# Patient Record
Sex: Female | Born: 1935 | Race: White | Hispanic: No | Smoking: Never smoker
Health system: Southern US, Community
[De-identification: ages and names within clinical notes are randomized; demographics above are authoritative.]

## PROBLEM LIST (undated history)

## (undated) DIAGNOSIS — J690 Pneumonitis due to inhalation of food and vomit: Secondary | ICD-10-CM

## (undated) DIAGNOSIS — R1310 Dysphagia, unspecified: Secondary | ICD-10-CM

## (undated) DIAGNOSIS — Z3490 Encounter for supervision of normal pregnancy, unspecified, unspecified trimester: Secondary | ICD-10-CM

## (undated) DIAGNOSIS — I4891 Unspecified atrial fibrillation: Secondary | ICD-10-CM

## (undated) DIAGNOSIS — H409 Unspecified glaucoma: Secondary | ICD-10-CM

## (undated) HISTORY — DX: Dysphagia, unspecified: R13.10

## (undated) HISTORY — DX: Pneumonitis due to inhalation of food and vomit: J69.0

## (undated) HISTORY — DX: Unspecified atrial fibrillation: I48.91

## (undated) HISTORY — PX: TONSILLECTOMY: SUR1361

---
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
## 2017-10-14 DIAGNOSIS — H409 Unspecified glaucoma: Secondary | ICD-10-CM

## 2017-10-14 HISTORY — DX: Unspecified glaucoma: H40.9

## 2017-12-16 ENCOUNTER — Other Ambulatory Visit: Payer: Self-pay

## 2017-12-16 ENCOUNTER — Inpatient Hospital Stay (HOSPITAL_COMMUNITY)
Admission: EM | Admit: 2017-12-16 | Discharge: 2017-12-20 | DRG: 177 | Disposition: A | Discharge status: Hospice | Payer: Medicare Other | Attending: Internal Medicine | Admitting: Internal Medicine

## 2017-12-16 ENCOUNTER — Emergency Department (HOSPITAL_COMMUNITY): Payer: Medicare Other

## 2017-12-16 ENCOUNTER — Encounter (HOSPITAL_COMMUNITY): Payer: Self-pay | Admitting: Radiology

## 2017-12-16 ENCOUNTER — Inpatient Hospital Stay (HOSPITAL_COMMUNITY): Payer: Medicare Other

## 2017-12-16 DIAGNOSIS — E861 Hypovolemia: Secondary | ICD-10-CM | POA: Diagnosis present

## 2017-12-16 DIAGNOSIS — Z7189 Other specified counseling: Secondary | ICD-10-CM | POA: Diagnosis not present

## 2017-12-16 DIAGNOSIS — J9601 Acute respiratory failure with hypoxia: Secondary | ICD-10-CM

## 2017-12-16 DIAGNOSIS — Z515 Encounter for palliative care: Secondary | ICD-10-CM | POA: Diagnosis not present

## 2017-12-16 DIAGNOSIS — J470 Bronchiectasis with acute lower respiratory infection: Secondary | ICD-10-CM | POA: Diagnosis not present

## 2017-12-16 DIAGNOSIS — I4892 Unspecified atrial flutter: Secondary | ICD-10-CM | POA: Diagnosis present

## 2017-12-16 DIAGNOSIS — Z66 Do not resuscitate: Secondary | ICD-10-CM | POA: Diagnosis not present

## 2017-12-16 DIAGNOSIS — I2584 Coronary atherosclerosis due to calcified coronary lesion: Secondary | ICD-10-CM | POA: Diagnosis not present

## 2017-12-16 DIAGNOSIS — I517 Cardiomegaly: Secondary | ICD-10-CM

## 2017-12-16 DIAGNOSIS — I2510 Atherosclerotic heart disease of native coronary artery without angina pectoris: Secondary | ICD-10-CM | POA: Diagnosis present

## 2017-12-16 DIAGNOSIS — R1310 Dysphagia, unspecified: Secondary | ICD-10-CM | POA: Diagnosis not present

## 2017-12-16 DIAGNOSIS — J479 Bronchiectasis, uncomplicated: Secondary | ICD-10-CM

## 2017-12-16 DIAGNOSIS — I4891 Unspecified atrial fibrillation: Secondary | ICD-10-CM | POA: Diagnosis present

## 2017-12-16 DIAGNOSIS — N179 Acute kidney failure, unspecified: Secondary | ICD-10-CM | POA: Diagnosis not present

## 2017-12-16 DIAGNOSIS — R627 Adult failure to thrive: Secondary | ICD-10-CM | POA: Diagnosis present

## 2017-12-16 DIAGNOSIS — I350 Nonrheumatic aortic (valve) stenosis: Secondary | ICD-10-CM | POA: Diagnosis present

## 2017-12-16 DIAGNOSIS — R4702 Dysphasia: Secondary | ICD-10-CM | POA: Diagnosis present

## 2017-12-16 DIAGNOSIS — Z79899 Other long term (current) drug therapy: Secondary | ICD-10-CM | POA: Diagnosis not present

## 2017-12-16 DIAGNOSIS — N189 Chronic kidney disease, unspecified: Secondary | ICD-10-CM | POA: Diagnosis not present

## 2017-12-16 DIAGNOSIS — N183 Chronic kidney disease, stage 3 (moderate): Secondary | ICD-10-CM | POA: Diagnosis present

## 2017-12-16 DIAGNOSIS — R011 Cardiac murmur, unspecified: Secondary | ICD-10-CM | POA: Diagnosis present

## 2017-12-16 DIAGNOSIS — L8990 Pressure ulcer of unspecified site, unspecified stage: Secondary | ICD-10-CM | POA: Diagnosis present

## 2017-12-16 DIAGNOSIS — J690 Pneumonitis due to inhalation of food and vomit: Secondary | ICD-10-CM

## 2017-12-16 DIAGNOSIS — H409 Unspecified glaucoma: Secondary | ICD-10-CM | POA: Diagnosis present

## 2017-12-16 DIAGNOSIS — Z823 Family history of stroke: Secondary | ICD-10-CM

## 2017-12-16 DIAGNOSIS — I959 Hypotension, unspecified: Secondary | ICD-10-CM | POA: Diagnosis present

## 2017-12-16 DIAGNOSIS — R7309 Other abnormal glucose: Secondary | ICD-10-CM

## 2017-12-16 HISTORY — DX: Unspecified glaucoma: H40.9

## 2017-12-16 HISTORY — DX: Pneumonitis due to inhalation of food and vomit: J69.0

## 2017-12-16 HISTORY — DX: Unspecified atrial fibrillation: I48.91

## 2017-12-16 HISTORY — DX: Encounter for supervision of normal pregnancy, unspecified, unspecified trimester: Z34.90

## 2017-12-16 LAB — URINALYSIS, ROUTINE W REFLEX MICROSCOPIC
BILIRUBIN URINE: NEGATIVE
Glucose, UA: NEGATIVE mg/dL
KETONES UR: NEGATIVE mg/dL
LEUKOCYTES UA: NEGATIVE
Nitrite: NEGATIVE
PROTEIN: 30 mg/dL — AB
Specific Gravity, Urine: 1.018 (ref 1.005–1.030)
pH: 5 (ref 5.0–8.0)

## 2017-12-16 LAB — COMPREHENSIVE METABOLIC PANEL
ALT: 32 U/L (ref 14–54)
ANION GAP: 14 (ref 5–15)
AST: 85 U/L — AB (ref 15–41)
Albumin: 3.9 g/dL (ref 3.5–5.0)
Alkaline Phosphatase: 93 U/L (ref 38–126)
BUN: 27 mg/dL — AB (ref 6–20)
CHLORIDE: 103 mmol/L (ref 101–111)
CO2: 21 mmol/L — ABNORMAL LOW (ref 22–32)
Calcium: 9.5 mg/dL (ref 8.9–10.3)
Creatinine, Ser: 1.5 mg/dL — ABNORMAL HIGH (ref 0.44–1.00)
GFR calc Af Amer: 33 mL/min — ABNORMAL LOW (ref >60)
GFR, EST NON AFRICAN AMERICAN: 29 mL/min — AB (ref >60)
Glucose, Bld: 136 mg/dL — ABNORMAL HIGH (ref 65–99)
POTASSIUM: 3.8 mmol/L (ref 3.5–5.1)
Sodium: 138 mmol/L (ref 135–145)
TOTAL PROTEIN: 7.7 g/dL (ref 6.5–8.1)
Total Bilirubin: 1.1 mg/dL (ref 0.3–1.2)

## 2017-12-16 LAB — CBC
HCT: 38.4 % (ref 36.0–46.0)
HEMATOCRIT: 37 % (ref 36.0–46.0)
Hemoglobin: 13.3 g/dL (ref 12.0–15.0)
Hemoglobin: 12.9 g/dL (ref 12.0–15.0)
MCH: 31 pg (ref 26.0–34.0)
MCH: 31.3 pg (ref 26.0–34.0)
MCHC: 34.6 g/dL (ref 30.0–36.0)
MCHC: 34.9 g/dL (ref 30.0–36.0)
MCV: 89.5 fL (ref 78.0–100.0)
MCV: 89.8 fL (ref 78.0–100.0)
PLATELETS: 220 10*3/uL (ref 150–400)
Platelets: 214 10*3/uL (ref 150–400)
RBC: 4.29 MIL/uL (ref 3.87–5.11)
RBC: 4.12 MIL/uL (ref 3.87–5.11)
RDW: 14 % (ref 11.5–15.5)
RDW: 14 % (ref 11.5–15.5)
WBC: 12 10*3/uL — AB (ref 4.0–10.5)
WBC: 5.3 10*3/uL (ref 4.0–10.5)

## 2017-12-16 LAB — CREATININE, SERUM
CREATININE: 1.37 mg/dL — AB (ref 0.44–1.00)
GFR, EST AFRICAN AMERICAN: 37 mL/min — AB (ref >60)
GFR, EST NON AFRICAN AMERICAN: 32 mL/min — AB (ref >60)

## 2017-12-16 LAB — I-STAT TROPONIN, ED: Troponin i, poc: 0.06 ng/mL (ref 0.00–0.08)

## 2017-12-16 MED ORDER — SODIUM CHLORIDE 0.9 % IV SOLN
INTRAVENOUS | Status: DC
  Administered 2017-12-16: 21:00:00 via INTRAVENOUS

## 2017-12-16 MED ORDER — SODIUM CHLORIDE 0.9 % IV SOLN
1.0000 g | Freq: Once | INTRAVENOUS | Status: AC
  Administered 2017-12-16: 1 g via INTRAVENOUS
  Filled 2017-12-16: qty 10

## 2017-12-16 MED ORDER — IPRATROPIUM-ALBUTEROL 0.5-2.5 (3) MG/3ML IN SOLN
3.0000 mL | Freq: Once | RESPIRATORY_TRACT | Status: AC
  Administered 2017-12-16: 3 mL via RESPIRATORY_TRACT
  Filled 2017-12-16: qty 3

## 2017-12-16 MED ORDER — DILTIAZEM HCL 30 MG PO TABS: 30.0000 mg | ORAL_TABLET | Freq: Four times a day (QID) | ORAL | Status: DC

## 2017-12-16 MED ORDER — SODIUM CHLORIDE 0.9 % IV SOLN: 1.0000 g | INTRAVENOUS | Status: DC

## 2017-12-16 MED ORDER — ENOXAPARIN SODIUM 30 MG/0.3ML ~~LOC~~ SOLN
30.0000 mg | SUBCUTANEOUS | Status: DC
  Administered 2017-12-16 – 2017-12-18 (×3): 30 mg via SUBCUTANEOUS
  Filled 2017-12-16 (×3): qty 0.3

## 2017-12-16 MED ORDER — BRIMONIDINE TARTRATE-TIMOLOL 0.2-0.5 % OP SOLN
1.0000 [drp] | Freq: Two times a day (BID) | OPHTHALMIC | Status: DC
  Filled 2017-12-16: qty 5

## 2017-12-16 MED ORDER — DOXYCYCLINE HYCLATE 100 MG PO TABS
100.0000 mg | ORAL_TABLET | Freq: Once | ORAL | Status: AC
  Administered 2017-12-16: 100 mg via ORAL
  Filled 2017-12-16: qty 1

## 2017-12-16 MED ORDER — DILTIAZEM HCL 25 MG/5ML IV SOLN
10.0000 mg | Freq: Once | INTRAVENOUS | Status: AC
Start: 2017-12-16 — End: 2017-12-16
  Administered 2017-12-16: 10 mg via INTRAVENOUS
  Filled 2017-12-16: qty 5

## 2017-12-16 MED ORDER — DOXYCYCLINE HYCLATE 100 MG IV SOLR
100.0000 mg | Freq: Two times a day (BID) | INTRAVENOUS | Status: DC
Start: 2017-12-17 — End: 2017-12-17
  Administered 2017-12-17: 100 mg via INTRAVENOUS
  Filled 2017-12-16: qty 100

## 2017-12-16 MED ORDER — LATANOPROST 0.005 % OP SOLN
1.0000 [drp] | Freq: Every day | OPHTHALMIC | Status: DC
  Administered 2017-12-16 – 2017-12-19 (×4): 1 [drp] via OPHTHALMIC
  Filled 2017-12-16: qty 2.5

## 2017-12-16 MED ORDER — TIMOLOL MALEATE 0.5 % OP SOLN
1.0000 [drp] | Freq: Two times a day (BID) | OPHTHALMIC | Status: DC
Start: 2017-12-16 — End: 2017-12-20
  Administered 2017-12-16 – 2017-12-20 (×8): 1 [drp] via OPHTHALMIC
  Filled 2017-12-16: qty 5

## 2017-12-16 MED ORDER — BRIMONIDINE TARTRATE 0.2 % OP SOLN
1.0000 [drp] | Freq: Two times a day (BID) | OPHTHALMIC | Status: DC
Start: 2017-12-16 — End: 2017-12-20
  Administered 2017-12-16 – 2017-12-20 (×8): 1 [drp] via OPHTHALMIC
  Filled 2017-12-16: qty 5

## 2017-12-16 MED ORDER — DILTIAZEM HCL 100 MG IV SOLR
5.0000 mg/h | INTRAVENOUS | Status: DC
  Administered 2017-12-16: 5 mg/h via INTRAVENOUS
  Filled 2017-12-16: qty 100

## 2017-12-16 MED ORDER — DOXYCYCLINE HYCLATE 100 MG PO TABS: 100.0000 mg | ORAL_TABLET | Freq: Two times a day (BID) | ORAL | Status: DC

## 2017-12-16 MED ORDER — ENOXAPARIN SODIUM 40 MG/0.4ML ~~LOC~~ SOLN: 40.0000 mg | SUBCUTANEOUS | Status: DC

## 2017-12-16 NOTE — ED Provider Notes (Signed)
[facility] Provider Note   CSN: [id number] Arrival date & time: 12/16/17  1141     History   Chief Complaint Chief Complaint  Patient presents with  . Weakness    HPI Natalie Holt is a 84 y.o. female.  HPI   Patient is a very pleasant 84 year old female brought in by her son.  Patient lives at home.  She has no complaints.  She does not want to be here and feels her usual state of health.  She is brought in by son.  Joanne GavelSutton came into the patient's house today and she was on the toilet he asked whether she needs help she said no.  But she was unable to get up off the toilet her seat herself.  He helped her stand from the toilet.  He feels that she is globally weak.  She denies. focal symptoms.  Patient is not seen a physician over 50 years.  Patient's son thinks that she needs to be in a higher level of care than living by herself on the second floor.  Patient herself denies any chest pain shortness of breath weakness or other complaints.   No past medical history on file.  There are no active problems to display for this patient.     OB History    No data available       Home Medications    Prior to Admission medications   Not on File    Family History No family history on file.  Social History Social History   Tobacco Use  . Smoking status: Not on file  Substance Use Topics  . Alcohol use: Not on file  . Drug use: Not on file     Allergies   Patient has no allergy information on record.   Review of Systems Review of Systems  Constitutional: Negative for activity change.  Respiratory: Negative for shortness of breath.   Cardiovascular: Negative for chest pain.  Gastrointestinal: Negative for abdominal pain.     Physical Exam Updated Vital Signs BP (!) 120/55 (BP Location: Right Arm)   Temp 97.8 F (36.6 C) (Oral)   Resp (!) 23   SpO2 97%   Physical Exam  Constitutional: She is oriented to person,  place, and time. She appears well-developed and well-nourished.  HENT:  Head: Normocephalic and atraumatic.  Eyes: Right eye exhibits no discharge. Left eye exhibits no discharge.  Cardiovascular: Normal rate and regular rhythm.  Murmur heard. Loud systolic murmur.  Pulmonary/Chest: Effort normal and breath sounds normal. She has no wheezes. She has no rales.  Abdominal: Soft. She exhibits no distension. There is no tenderness.  Neurological: She is oriented to person, place, and time. No cranial nerve deficit.  Skin: Skin is warm and dry. She is not diaphoretic.  Psychiatric: She has a normal mood and affect.  Nursing note and vitals reviewed.    ED Treatments / Results  Labs (all labs ordered are listed, but only abnormal results are displayed) Labs Reviewed  COMPREHENSIVE METABOLIC PANEL  CBC WITH DIFFERENTIAL/PLATELET  URINALYSIS, ROUTINE W REFLEX MICROSCOPIC  I-STAT TROPONIN, ED    EKG  EKG Interpretation  Date/Time:  Tuesday December 16 2017 12:49:08 EST Ventricular Rate:  93 PR Interval:    QRS Duration: 75 QT Interval:  383 QTC Calculation: 477 R Axis:   21 Text Interpretation:  Sinus rhythm Normal sinus rhythm Confirmed by Corlis LeakMackuen, Yuleidy Rappleye ([id number]) on 12/16/2017 12:57:05 PM  Radiology No results found.  Procedures Procedures (including critical care time)  CRITICAL CARE Performed by: Arlana Hove Total critical care time: 75 minutes Critical care time was exclusive of separately billable procedures and treating other patients. Critical care was necessary to treat or prevent imminent or life-threatening deterioration. Critical care was time spent personally by me on the following activities: development of treatment plan with patient and/or surrogate as well as nursing, discussions with consultants, evaluation of patient's response to treatment, examination of patient, obtaining history from patient or surrogate, ordering and performing treatments  and interventions, ordering and review of laboratory studies, ordering and review of radiographic studies, pulse oximetry and re-evaluation of patient's condition.   Medications Ordered in ED Medications - No data to display   Initial Impression / Assessment and Plan / ED Course  I have reviewed the triage vital signs and the nursing notes.  Pertinent labs & imaging results that were available during my care of the patient were reviewed by me and considered in my medical decision making (see chart for details).     Patient is a very pleasant 84 year old female brought in by her son.  Patient lives at home.  She has no complaints.  She does not want to be here and feels her usual state of health.  She is brought in by son.  Joanne Gavel came into the patient's house today and she was on the toilet he asked whether she needs help she said no.  But she was unable to get up off the toilet her seat herself.  He helped her stand from the toilet.  He feels that she is globally weak.  She denies. focal symptoms.  Patient is not seen a physician over 50 years.  Patient's son thinks that she needs to be in a higher level of care than living by herself on the second floor.  Patient herself denies any chest pain shortness of breath weakness or other complaints.  12:57 PM Patient appears very well.  Vital signs are normal.  Physical exam is reassuring.  We will get baseline lab work, however I am not sure patient will meet inpatient requirements for admission.  We will have case management touch base with son.  Patient clearly does have a systolic murmur which I suspect to be moderate to severe aortic stenosis, however the age of 59 and not likely a great surgical candidate, will have patient follow-up with an outpatient cardiology.  3 pm.  Awaiting patient's labs.  Xray normal. Will striaght cath for urine.    4:52 PM Patient is now looks much worse.  Patient had a cough earlier in the day but has now been  coughing constantly, gurgling sounds.  Patient not hypoxic, requiring oxygen people patent patient's heart rate was 72 on arrival and now is 123.  It appears the patient's gone into A. fib.  With the auscultation of murmur of aortic stenosis, I am concerned this caused her to flash.  Had discussion with son and patient.  She does not want BiPAP, intubation, CPR.  Patient said "I am ready to die".  Patient's son recognizes her wishes.  And I let him know that she is close to death, patient  son would like to leave and is left now.   Paitent doing better after dilt, with HR at 112. Repeat CXR shows beginning effusions, no frank CHF.  Will hold on Lasix right now. Will have respiratory deep suctioning.  Requiring on 3L of oxygen at this  time.  Discussed with medicine to admit.   Final Clinical Impressions(s) / ED Diagnoses   Final diagnoses:  None    ED Discharge Orders    None       Madelon Welsch, Cindee Salt, MD 12/17/17 1523

## 2017-12-16 NOTE — Discharge Planning (Signed)
Natalie Lemus J. Lucretia RoersWood, RN, BSN, [employer] [phone] Spoke with pt at bedside regarding discharge planning [facility]. Offered pt list of home health agencies to choose from.  [facility]. [facility]rew [facility] notified. Patient made aware that [facility] will be in contact in 24 hours.  Pt will also be seen by Natalie BrodPhilip Asenso, MD for PCP establishment.  No DME needs identified at this time.

## 2017-12-16 NOTE — H&P (Signed)
History and Physical    Natalie Holt ZHY:[id number] DOB: 08/02/1923 DOA: 12/16/2017  PCP: Patient, No Pcp Per (Confirm with patient/family/NH records and if not entered, [facility]) Patient coming from: Home  I have personally briefly reviewed patient[facility]  Chief Complaint: Weakness  HPI: Natalie RichardsViola P Bartko is a 84 y.o. female with medical history significant of glaucoma who is not seen a primary physician in over 50 years.   Patient is a very pleasant 84 year old female brought in by her son.  Patient lives at home.  She has no complaints.  She does not want to be here and feels her usual state of health.  She is brought in by son.  Son came into the patient's house today and she was on the toilet he asked whether she needs help she said no.  But she was unable to get up off the toilet her seat herself.  He helped her stand from the toilet.  He feels that she is globally weak.  She denies. focal symptoms.  Patient is not seen a physician over 50 years.  Patient's son thinks that she needs to be in a higher level of care than living by herself on the second floor.  Patient herself denies any chest pain shortness of breath weakness or other complaints.  During her time in the emergency department the patient was offered something to drink and a pill and she seemed to be choking on it.  Her nurse immediately made her n.p.o.  Her condition deteriorated while in the emergency department.  Appears that she is having difficulty managing her secretions and she may have aspirated likely prior to admission.  Throughout today the patient's condition worsened and she developed coughing with rhonchorous breath sounds and then developed an oxygen requirement.  Her heart rate shot up and she went into atrial fibrillation. Had discussion with patient.  She does not want BiPAP, intubation, CPR.  Patient said "I am ready to die".     I ordered  a CT scan of her chest and it shows that she has infiltrates consistent with aspiration.  She will be admitted into the hospital for treatment of aspiration pneumonia and resultant atrial fibrillation with rapid ventricular response due to aspiration pneumonia  Patient currently denies any chest pain she is obviously short of breath but states that she is not, she is using accessory muscles, she is coughing with a wet sounding cough states that she feels perfectly fine.  She denies palpitation or sensation of her heart beating fast.  She denies nausea vomiting diarrhea or constipation.  She has no problems with blurry vision or double vision.  She does state that she takes drops for her eyes she thinks she may have glaucoma.  SHe recently saw the eye doctor because her vision was not good.  Review of Systems: As per HPI otherwise all other systems reviewed and  negative.    Past Medical History:  Diagnosis Date  . Glaucoma 2019  . Pregnancy 1948   G4 P4004    Past Surgical History:  Procedure Laterality Date  . TONSILLECTOMY     as a child     reports that  has never smoked. she has never used smokeless tobacco. She reports that she does not drink alcohol or use drugs.  No Known Allergies  Family History  Problem Relation Age of Onset  . Bowel Disease Daughter   .  Stroke Son   . Other Son    Her daughter died 5 days before her 50[age] birthday due to a bowel obstruction she has a son who had a stroke and another son who is had agent orange exposure.  Prior to Admission medications   Medication Sig Start Date End Date Taking? Authorizing Provider  COMBIGAN 0.2-0.5 % ophthalmic solution Place 1 drop into both eyes 2 (two) times daily. 11/05/17  Yes [provider]  latanoprost (XALATAN) 0.005 % ophthalmic solution Place 1 drop into both eyes at bedtime. 10/27/17  Yes [provider]    Physical Exam: Vitals:   12/16/17 1845 12/16/17 1851 12/16/17 1945 12/16/17 2046    BP: (!) 124/55   100/62  Pulse: (!) 116   (!) 129  Resp: (!) 27  (!) 32   Temp:  97.8 F (36.6 C)  98.2 F (36.8 C)  TempSrc:    Oral  SpO2: 96%   93%  Weight:    64.5 kg (142 lb 3.2 oz)  Height:    5\' 7"  (1.702 m)   .TCS Constitutional: Acutely ill-appearing white female calmly sitting on the stretcher but using accessory muscles with sats in the low 90s at rest when she speaks it drops down into the mid 80s Vitals:   12/16/17 1845 12/16/17 1851 12/16/17 1945 12/16/17 2046  BP: (!) 124/55   100/62  Pulse: (!) 116   (!) 129  Resp: (!) 27  (!) 32   Temp:  97.8 F (36.6 C)  98.2 F (36.8 C)  TempSrc:    Oral  SpO2: 96%   93%  Weight:    64.5 kg (142 lb 3.2 oz)  Height:    5\' 7"  (1.702 m)   Eyes: PERRL, lids and conjunctivae normal ENMT: Mucous membranes are moist. Posterior pharynx clear of any exudate or lesions.Normal dentition.  Neck: normal, supple, no masses, no thyromegaly Respiratory: Coarse rhonchorous breath sounds throughout no wheezing no crackles.  Increased respiratory effort with accessory muscle use Cardiovascular: Irregularly irregular rate and rhythm, 3/6 musical murmur across the precordium/no rubs /no gallops. No extremity edema. 2+ pedal pulses. No carotid bruits.  Abdomen: no tenderness, no masses palpated. No hepatosplenomegaly. Bowel sounds positive.  Musculoskeletal: no clubbing / cyanosis. No joint deformity upper and lower extremities. Good ROM, no contractures. Normal muscle tone.  Skin: no rashes, lesions, ulcers. No induration Neurologic: CN 2-12 grossly intact. Sensation intact, DTR normal. Strength 5/5 in all 4.  Psychiatric: Normal judgment and insight. Alert and oriented x 3. Normal mood.    Labs on Admission: I have personally reviewed following labs and imaging studies  CBC: Recent Labs  Lab 12/16/17 1538 12/16/17 2100  WBC 12.0* 5.3  HGB 13.3 12.9  HCT 38.4 37.0  MCV 89.5 89.8  PLT 220 214   Basic Metabolic Panel: Recent Labs   Lab 12/16/17 1538  NA 138  K 3.8  CL 103  CO2 21*  GLUCOSE 136*  BUN 27*  CREATININE 1.50*  CALCIUM 9.5   GFR: Estimated Creatinine Clearance: 22.8 mL/min (A) (by C-G formula based on SCr of 1.5 mg/dL (H)). Liver Function Tests: Recent Labs  Lab 12/16/17 1538  AST 85*  ALT 32  ALKPHOS 93  BILITOT 1.1  PROT 7.7  ALBUMIN 3.9   No results for input(s): LIPASE, AMYLASE in the last 168 hours. No results for input(s): AMMONIA in the last 168 hours. Coagulation Profile: No results for input(s): INR, PROTIME in the last 168 hours.  Cardiac Enzymes: No results for input(s): CKTOTAL, CKMB, CKMBINDEX, TROPONINI in the last 168 hours. BNP (last 3 results) No results for input(s): PROBNP in the last 8760 hours. HbA1C: No results for input(s): HGBA1C in the last 72 hours. CBG: No results for input(s): GLUCAP in the last 168 hours. Lipid Profile: No results for input(s): CHOL, HDL, LDLCALC, TRIG, CHOLHDL, LDLDIRECT in the last 72 hours. Thyroid Function Tests: No results for input(s): TSH, T4TOTAL, FREET4, T3FREE, THYROIDAB in the last 72 hours. Anemia Panel: No results for input(s): VITAMINB12, FOLATE, FERRITIN, TIBC, IRON, RETICCTPCT in the last 72 hours. Urine analysis:    Component Value Date/Time   COLORURINE YELLOW 12/16/2017 1600   APPEARANCEUR HAZY (A) 12/16/2017 1600   LABSPEC 1.018 12/16/2017 1600   PHURINE 5.0 12/16/2017 1600   GLUCOSEU NEGATIVE 12/16/2017 1600   HGBUR LARGE (A) 12/16/2017 1600   BILIRUBINUR NEGATIVE 12/16/2017 1600   KETONESUR NEGATIVE 12/16/2017 1600   PROTEINUR 30 (A) 12/16/2017 1600   NITRITE NEGATIVE 12/16/2017 1600   LEUKOCYTESUR NEGATIVE 12/16/2017 1600    Radiological Exams on Admission: Dg Chest 2 View  Result Date: 12/16/2017 CLINICAL DATA:  Productive cough for 2 days EXAM: CHEST  2 VIEW COMPARISON:  None. FINDINGS: Cardiac shadow is at the upper limits of normal in size. Aortic calcifications are seen. The lungs are mildly  hyperinflated. No focal infiltrate or sizable effusion is seen. No bony abnormality is noted. IMPRESSION: No active cardiopulmonary disease. Electronically Signed   By: Alcide Clever M.D.   On: 12/16/2017 13:22   Ct Head Wo Contrast  Result Date: 12/16/2017 CLINICAL DATA:  84 year old female found slumped over toilet. Appears weak. Initial encounter. EXAM: CT HEAD WITHOUT CONTRAST TECHNIQUE: Contiguous axial images were obtained from the base of the skull through the vertex without intravenous contrast. COMPARISON:  None. FINDINGS: Brain: No intracranial hemorrhage or CT evidence of large acute infarct. Mild chronic microvascular changes. Moderate global atrophy. No intracranial mass lesion noted on this unenhanced exam. Vascular: Vascular calcifications. Skull: Negative Sinuses/Orbits: No acute orbital abnormality right. Minimal right maxillary sinus and ethmoid sinus air cells bilaterally. Other: Mastoid air cells and middle ear cavities are clear. IMPRESSION: No acute intracranial abnormality. Mild chronic microvascular changes. Moderate global atrophy. Electronically Signed   By: Lacy Duverney M.D.   On: 12/16/2017 13:48   Ct Chest Wo Contrast  Result Date: 12/16/2017 CLINICAL DATA:  Increased weakness. Patient aspirated with tachycardia. EXAM: CT CHEST WITHOUT CONTRAST TECHNIQUE: Multidetector CT imaging of the chest was performed following the standard protocol without IV contrast. COMPARISON:  12/16/2017 CXR FINDINGS: Cardiovascular: Atherosclerosis of the great vessels with normal branch pattern. Mild to moderate aortic atherosclerosis without aneurysm. Heart is mildly enlarged without pericardial effusion. There is left main and three-vessel coronary arteriosclerosis. Pulmonary vasculature is unremarkable for this unenhanced study. Mediastinum/Nodes: Normal thyroid gland without focal mass. Patent trachea and mainstem bronchi. Unremarkable CT appearance of the esophagus. No mediastinal or hilar  lymphadenopathy given limitations of a noncontrast study. Lungs/Pleura: Patchy pneumonic consolidation and atelectasis is seen in the left lower lobe with faint ground-glass opacities in the left upper lobe and lingula. Minimal atelectasis is seen in the right lower lobe. There is mild bilateral bronchiectasis and bronchiolectasis. Upper Abdomen: Slight surface nodularity of the liver compatible with morphologic changes of cirrhosis. No space-occupying mass nor biliary dilatation. No acute abnormality in the upper abdomen. Musculoskeletal: No chest wall mass or suspicious bone lesions identified. IMPRESSION: 1. Left lower lobe pulmonary consolidations compatible with aspiration  pneumonia given reported history. Lesser degree of faint ground-glass opacities are noted in the left upper lobe and lingula. 2. There is bilateral bronchiectasis and bronchiolectasis. 3. Cardiomegaly with coronary arteriosclerosis. 4. Aortic atherosclerosis. Aortic Atherosclerosis (ICD10-I70.0). Electronically Signed   By: Tollie Eth M.D.   On: 12/16/2017 19:58   Dg Chest Portable 1 View  Result Date: 12/16/2017 CLINICAL DATA:  Generalized weakness. EXAM: PORTABLE CHEST 1 VIEW COMPARISON:  12/16/2017 FINDINGS: Patient rotated left. Mild cardiomegaly with transverse aortic atherosclerosis. No right and no definite left pleural effusion. No pneumothorax. Biapical pleural thickening. medial right lung base and possible developing left base airspace disease. IMPRESSION: Hyperinflation with development of right and possible left base airspace disease. Most likely atelectasis on this AP portable radiograph. Early pneumonia or aspiration could look similar. Cardiomegaly.  Aortic Atherosclerosis (ICD10-I70.0). Electronically Signed   By: Jeronimo Greaves M.D.   On: 12/16/2017 17:10    EKG: Independently reviewed.  Atrial flutter with premature ventricular complexes  Assessment/Plan Principal Problem:   Aspiration pneumonia (HCC) Active  Problems:   Atrial fibrillation with RVR (HCC)   Acute respiratory failure with hypoxemia (HCC)   Dysphagia   Elevated random blood glucose level   Acute kidney injury superimposed on CKD (HCC)   Bronchiectasis (HCC)   Coronary atherosclerosis due to calcified coronary lesion of native artery   Cardiomegaly   DNR (do not resuscitate) discussion   1.  Aspiration pneumonia: We will start the patient on ceftriaxone and doxycycline.  Patient has aspiration pneumonia it is likely this began at home and she started to get weak and then when she presented she already had the aspiration and started to deteriorate.  She was seen in the emergency room to have difficulty handling liquids with pill.  She is now n.p.o. we are consulting speech therapy for evaluation.  She will require oxygen supplementation due to acute respiratory failure.  2.  Atrial fibrillation with rapid ventricular response: Patient received a dose of diltiazem in the emergency department initially look like she responded but upon arrival to the floor her heart rate was in the 120s.  I am going to start a diltiazem drip.  I am going to order diltiazem 30 p.o. every 6 hours to start in the a.m. and hopefully then will be able to wean her drip off.  Patient is not interested in extensive evaluations and I am not sure an echocardiogram would give [place] any great benefit as she simply wants to go back home.  She does not like to be in the hospital she does not seek extensive medical care.  She does not respond to treatment she may be a candidate for palliative care.  3.  Acute respiratory failure with hypoxemia: Likely due to aspiration pneumonia.  We will continue oxygen supplementation for comfort.  4.  Dysphasia: Patient to have speech therapy evaluation.  She may benefit from speech therapy to improve her swallow.  5.  Elevated random blood glucose level: Noted will recheck in a.m.  6.  Acute kidney injury superimposed on chronic kidney  disease: We will hydrate patient overnight and recheck BMP in a.m.  As we have no prior BMPs are unable to determine the exact chronicity and nature of her kidney injury.  7.  Bronchiectasis: Patient not wheezing does not seem to have any acute decompensation due to her bronchiectasis.  Will monitor closely.  She may benefit from belies ears if wheezing develops.  CT scan did not show extensive amounts of pulmonary  secretions.  If patient continues to sound very wet may consider adding Mucinex.  8.  Coronary atherosclerosis due to calcified coronary lesion of native artery: Patient denies any chest pain we will monitor do not believe starting additional medication would be in her overall benefit.  9.  Cardiomegaly: Noted.  Do not feel further workup would be necessary as patient is really not interested in extensive treatment.  She repeated multiple times during evaluation that she was "ready to die".  10.  DO NOT RESUSCITATE: I discussed with the patient she is not interested in any extensive medical workup or extensive treatment.  She does want to feel better from her congestion in her lungs.  She realizes that her heart rate is associated with the lung congestion.  Try to treat her conservatively and avoid extensive testing.  Patient does not significantly improve palliative care and hospice would be appropriate.   DVT prophylaxis: Lovenox Code Status: DO NOT RESUSCITATE Family Communication: ER physician spoke with patient's son he left before I was able to speak with him Disposition Plan: To be determine home versus skilled facility in 3-4 days Consults called: None Admission status: Inpatient   Lahoma Crocker MD FACP [facility] Pager [phone]  If 7PM-7AM, please contact night-coverage [email or web address] Password [email or web address]  12/16/2017, 10:02 PM

## 2017-12-16 NOTE — ED Notes (Signed)
Attempted to call report

## 2017-12-16 NOTE — ED Triage Notes (Signed)
Patient is from home where she lives alone.  Family arrived today and found patient slumped over on the toilet. Initial BP -90/60.  When transferred to the ambulance her strength returned.  BP- 120/78, HR - 104,  CBG - 197. Patient states that she has never been sick and does not feel sick now.

## 2017-12-16 NOTE — ED Notes (Signed)
Patient's heart rate is in the 120's continually. SaO2 decreasing. Patient with gurgling respirations.  MD notified

## 2017-12-16 NOTE — Progress Notes (Signed)
RT called to bedside by RN for NT suctioning. Pt suctioned x1 R nare with copious yellow/white thick mucous. Pt suctioned x 1 L nare with small amount of pink tinged thin secretions. Pt suctioned once more R nare with small amount of pink tinged thin secretions. Pt also orally suctioned and cleared moderate amount of pink tinged thin secretions. Pt with audible crackles throughout but slightly improved post suctioning

## 2017-12-17 ENCOUNTER — Inpatient Hospital Stay (HOSPITAL_COMMUNITY): Payer: Medicare Other

## 2017-12-17 DIAGNOSIS — N189 Chronic kidney disease, unspecified: Secondary | ICD-10-CM

## 2017-12-17 DIAGNOSIS — J9601 Acute respiratory failure with hypoxia: Secondary | ICD-10-CM

## 2017-12-17 DIAGNOSIS — N179 Acute kidney failure, unspecified: Secondary | ICD-10-CM

## 2017-12-17 DIAGNOSIS — I4891 Unspecified atrial fibrillation: Secondary | ICD-10-CM

## 2017-12-17 LAB — BASIC METABOLIC PANEL
ANION GAP: 12 (ref 5–15)
BUN: 24 mg/dL — AB (ref 6–20)
CALCIUM: 8.4 mg/dL — AB (ref 8.9–10.3)
CO2: 18 mmol/L — AB (ref 22–32)
Chloride: 109 mmol/L (ref 101–111)
Creatinine, Ser: 1.16 mg/dL — ABNORMAL HIGH (ref 0.44–1.00)
GFR calc Af Amer: 46 mL/min — ABNORMAL LOW (ref >60)
GFR, EST NON AFRICAN AMERICAN: 39 mL/min — AB (ref >60)
GLUCOSE: 145 mg/dL — AB (ref 65–99)
Potassium: 3.8 mmol/L (ref 3.5–5.1)
Sodium: 139 mmol/L (ref 135–145)

## 2017-12-17 LAB — CBC
HEMATOCRIT: 32.8 % — AB (ref 36.0–46.0)
HEMOGLOBIN: 11.3 g/dL — AB (ref 12.0–15.0)
MCH: 30.6 pg (ref 26.0–34.0)
MCHC: 34.5 g/dL (ref 30.0–36.0)
MCV: 88.9 fL (ref 78.0–100.0)
Platelets: 196 10*3/uL (ref 150–400)
RBC: 3.69 MIL/uL — ABNORMAL LOW (ref 3.87–5.11)
RDW: 14.2 % (ref 11.5–15.5)
WBC: 6 10*3/uL (ref 4.0–10.5)

## 2017-12-17 LAB — STREP PNEUMONIAE URINARY ANTIGEN: Strep Pneumo Urinary Antigen: NEGATIVE

## 2017-12-17 LAB — HIV ANTIBODY (ROUTINE TESTING W REFLEX): HIV Screen 4th Generation wRfx: NONREACTIVE

## 2017-12-17 MED ORDER — METOPROLOL TARTRATE 5 MG/5ML IV SOLN
5.0000 mg | Freq: Four times a day (QID) | INTRAVENOUS | Status: DC
Start: 2017-12-17 — End: 2017-12-20
  Administered 2017-12-17 – 2017-12-20 (×12): 5 mg via INTRAVENOUS
  Filled 2017-12-17 (×12): qty 5

## 2017-12-17 MED ORDER — SODIUM CHLORIDE 0.9 % IV SOLN
1.5000 g | Freq: Two times a day (BID) | INTRAVENOUS | Status: DC
  Administered 2017-12-17 – 2017-12-19 (×5): 1.5 g via INTRAVENOUS
  Filled 2017-12-17 (×5): qty 1.5

## 2017-12-17 MED ORDER — DILTIAZEM HCL 30 MG PO TABS: 30.0000 mg | ORAL_TABLET | Freq: Four times a day (QID) | ORAL | Status: DC

## 2017-12-17 MED ORDER — ORAL CARE MOUTH RINSE
15.0000 mL | Freq: Two times a day (BID) | OROMUCOSAL | Status: DC
  Administered 2017-12-17 – 2017-12-20 (×5): 15 mL via OROMUCOSAL

## 2017-12-17 NOTE — Progress Notes (Signed)
Patients BP 100/43(56). No longer on Cardizem gtt. MD notified, will continue to monitor.

## 2017-12-17 NOTE — Progress Notes (Signed)
Modified Barium Swallow Progress Note  Patient Details  Name: Natalie Holt MRN: [id number] Date of Birth: 02/04/1923  Today's Date: 12/17/2017  Modified Barium Swallow completed.  Full report located under Chart Review in the Imaging Section.  Brief recommendations include the following:  Clinical Impression    Pt presents with severe oropharyngeal dysphagia characterized by reduced laryngeal elevation/anterior mobility, base of tongue retraction, reduced epiglottic inversion, and weak lingual manipulation. Pt's epiglottis was noted to be smaller in size, leading to a smaller vallecular space and reduced deflection with thinner consistencies. Pt's smaller vallecula coupled with her reduced lingual propulsion led premature spillage to deflect off of full vallecula into laryngeal vestibule before and after the swallow. Pt used chin tuck strategy with Mod verbal/visual sues for thin liquid to contain premature spillage; however, strategy did not eliminate airway compromise. SLP provided max verbal cues to clear throat/cough to produce cough; however, her weak cough was minimally able to reduce penetrates. Given airway compromise with all consistencies tested, limited sensation, and reduced ability to protect airway, recommend NPO. Alternative means of nutrition potentially needed, depending on pt's GOC.       Swallow Evaluation Recommendations       SLP Diet Recommendations: NPO;Alternative means - temporary       Medication Administration: Via alternative means               Oral Care Recommendations: Oral care QID   Other Recommendations: Have oral suction available  Natalie Karl Holt SLP Student Clinician  Natalie Holt 12/17/2017,5:29 PM

## 2017-12-17 NOTE — Progress Notes (Signed)
Spoke w patient's son at nurses station. He is asking about SNF placement. CM asked him to discuss this with his mother as she was reported to not want to go to SNF, ambulates independent at home, does not use DME. I discussed Dc options w patient and she will be open to thinking about short term rehab if PT recommends it.

## 2017-12-17 NOTE — Progress Notes (Signed)
Pharmacy Antibiotic Note  Natalie RichardsViola P Holt is a 84 y.o. female admitted on 12/16/2017 with pneumonia.  Pharmacy has been consulted for Unasyn dosing.  Plan: Unasyn 1.5g IV q12h Monitor renal function and adjust as needed  Height: 5\' 7"  (170.2 cm) Weight: 142 lb 3.2 oz (64.5 kg) IBW/kg (Calculated) : 61.6  Temp (24hrs), Avg:98.2 F (36.8 C), Min:97.8 F (36.6 C), Max:99.3 F (37.4 C)  Recent Labs  Lab 12/16/17 1538 12/16/17 2100 12/17/17 0249  WBC 12.0* 5.3 6.0  CREATININE 1.50* 1.37* 1.16*    Estimated Creatinine Clearance: 29.5 mL/min (A) (by C-G formula based on SCr of 1.16 mg/dL (H)).    No Known Allergies  Antimicrobials this admission: Ceftriaxone 3/5 Doxy 3/5 >> 3/6 Unasyn 3/6>>   Microbiology results: 3/5 Blood >>  Thank you for allowing pharmacy to be a part of this patient's care.  Sallee Provencalurner, Pietra Zuluaga S 12/17/2017 10:04 AM

## 2017-12-17 NOTE — Progress Notes (Signed)
PROGRESS NOTE    Natalie Holt  MVH:[id number] DOB: 08/16/1923 DOA: 12/16/2017 PCP: Patient, No Pcp Per  Brief Narrative:Natalie Holt is a 84 y.o. female with medical history significant of glaucoma who is not seen a primary physician in over 50 years. Patient lives at home. Son came into the patient's house yesterday and she was unable to get up off the toilet her seat herself. He helped her stand from the toilet.He feels that she is globally weak. During her time in the emergency department the patient was offered something to drink and a pill and she seemed to be choking on it. Her condition deteriorated while in the emergency department.  Appears that she is having difficulty managing her secretions and she may have aspirated likely prior to admission.  Throughout the day the patient's condition worsened and she developed coughing with rhonchorous breath sounds and then developed an oxygen requirement.  Her heart rate shot up and she went into atrial fibrillation. Assessment & Plan:   Principal Problem:   Aspiration pneumonia (HCC) -Change antibiotics to Unasyn -SLP evaluation -Follow-up cultures  Acute hypoxic respiratory failure -Due to aspiration pneumonia as above    Atrial fibrillation with RVR (HCC) -Likely triggered by aspiration pneumonia -Focus on rate control, off Cardizem drip -Start IV metoprolol until swallowing sorted out and able to tolerate PO -Aspirin for now, poor candidate for long-term anticoagulation she mostly wants to focus on comfort primarily at this time    Dysphagia -As above  Renal insufficiency -Baseline unknown so cannot determine if this is acute or chronic  -Stop IV fluids, monitor her creatinine trend  Cardiomegaly and coronary atherosclerosis noted on CT chest -I do not feel further workup is necessary due to advanced age and patient is on interested in extensive treatment -ASA for now  DVT prophylaxis: lovenox Code Status: DNR Family  Communication:son at bedside Disposition Plan: Home pending improvement, Afib controlled, PNA improving  Consultants:      Procedures:   Antimicrobials:    Subjective: -Feels better today, continues to have a productive cough  Objective: Vitals:   12/17/17 0730 12/17/17 0900 12/17/17 1000 12/17/17 1135  BP: (!) 116/48 101/81 113/75 (!) 109/50  Pulse: (!) 123 (!) 108 (!) 102 (!) 104  Resp: 19 (!) 31 (!) 22   Temp: 98.1 F (36.7 C)   98.6 F (37 C)  TempSrc: Oral   Oral  SpO2:  (!) 89% 97% 95%  Weight:      Height:        Intake/Output Summary (Last 24 hours) at 12/17/2017 1242 Last data filed at 12/17/2017 0600 Gross per 24 hour  Intake 951.08 ml  Output -  Net 951.08 ml   Filed Weights   12/16/17 2046  Weight: 64.5 kg (142 lb 3.2 oz)    Examination:  General exam: Elderly female, Appears calm and comfortable, no distress Respiratory system: Rhonchi in the right base Cardiovascular system: S1 & S2 heard, RRR.  Gastrointestinal system: Abdomen is nondistended, soft and nontender.Normal bowel sounds heard. Central nervous system: Alert and oriented. No focal neurological deficits. Extremities: Symmetric 5 x 5 power. Skin: No rashes, lesions or ulcers Psychiatry:Mood & affect appropriate.     Data Reviewed:   CBC: Recent Labs  Lab 12/16/17 1538 12/16/17 2100 12/17/17 0249  WBC 12.0* 5.3 6.0  HGB 13.3 12.9 11.3*  HCT 38.4 37.0 32.8*  MCV 89.5 89.8 88.9  PLT 220 214 196   Basic Metabolic Panel: Recent Labs  Lab  12/16/17 1538 12/16/17 2100 12/17/17 0249  NA 138  --  139  K 3.8  --  3.8  CL 103  --  109  CO2 21*  --  18*  GLUCOSE 136*  --  145*  BUN 27*  --  24*  CREATININE 1.50* 1.37* 1.16*  CALCIUM 9.5  --  8.4*   GFR: Estimated Creatinine Clearance: 29.5 mL/min (A) (by C-G formula based on SCr of 1.16 mg/dL (H)). Liver Function Tests: Recent Labs  Lab 12/16/17 1538  AST 85*  ALT 32  ALKPHOS 93  BILITOT 1.1  PROT 7.7  ALBUMIN 3.9     No results for input(s): LIPASE, AMYLASE in the last 168 hours. No results for input(s): AMMONIA in the last 168 hours. Coagulation Profile: No results for input(s): INR, PROTIME in the last 168 hours. Cardiac Enzymes: No results for input(s): CKTOTAL, CKMB, CKMBINDEX, TROPONINI in the last 168 hours. BNP (last 3 results) No results for input(s): PROBNP in the last 8760 hours. HbA1C: No results for input(s): HGBA1C in the last 72 hours. CBG: No results for input(s): GLUCAP in the last 168 hours. Lipid Profile: No results for input(s): CHOL, HDL, LDLCALC, TRIG, CHOLHDL, LDLDIRECT in the last 72 hours. Thyroid Function Tests: No results for input(s): TSH, T4TOTAL, FREET4, T3FREE, THYROIDAB in the last 72 hours. Anemia Panel: No results for input(s): VITAMINB12, FOLATE, FERRITIN, TIBC, IRON, RETICCTPCT in the last 72 hours. Urine analysis:    Component Value Date/Time   COLORURINE YELLOW 12/16/2017 1600   APPEARANCEUR HAZY (A) 12/16/2017 1600   LABSPEC 1.018 12/16/2017 1600   PHURINE 5.0 12/16/2017 1600   GLUCOSEU NEGATIVE 12/16/2017 1600   HGBUR LARGE (A) 12/16/2017 1600   BILIRUBINUR NEGATIVE 12/16/2017 1600   KETONESUR NEGATIVE 12/16/2017 1600   PROTEINUR 30 (A) 12/16/2017 1600   NITRITE NEGATIVE 12/16/2017 1600   LEUKOCYTESUR NEGATIVE 12/16/2017 1600   Sepsis Labs: @LABRCNTIP (procalcitonin:4,lacticidven:4)  )No results found for this or any previous visit (from the past 240 hour(s)).       Radiology Studies: Dg Chest 2 View  Result Date: 12/16/2017 CLINICAL DATA:  Productive cough for 2 days EXAM: CHEST  2 VIEW COMPARISON:  None. FINDINGS: Cardiac shadow is at the upper limits of normal in size. Aortic calcifications are seen. The lungs are mildly hyperinflated. No focal infiltrate or sizable effusion is seen. No bony abnormality is noted. IMPRESSION: No active cardiopulmonary disease. Electronically Signed   By: Alcide Clever M.D.   On: 12/16/2017 13:22   Ct Head Wo  Contrast  Result Date: 12/16/2017 CLINICAL DATA:  84 year old female found slumped over toilet. Appears weak. Initial encounter. EXAM: CT HEAD WITHOUT CONTRAST TECHNIQUE: Contiguous axial images were obtained from the base of the skull through the vertex without intravenous contrast. COMPARISON:  None. FINDINGS: Brain: No intracranial hemorrhage or CT evidence of large acute infarct. Mild chronic microvascular changes. Moderate global atrophy. No intracranial mass lesion noted on this unenhanced exam. Vascular: Vascular calcifications. Skull: Negative Sinuses/Orbits: No acute orbital abnormality right. Minimal right maxillary sinus and ethmoid sinus air cells bilaterally. Other: Mastoid air cells and middle ear cavities are clear. IMPRESSION: No acute intracranial abnormality. Mild chronic microvascular changes. Moderate global atrophy. Electronically Signed   By: Lacy Duverney M.D.   On: 12/16/2017 13:48   Ct Chest Wo Contrast  Result Date: 12/16/2017 CLINICAL DATA:  Increased weakness. Patient aspirated with tachycardia. EXAM: CT CHEST WITHOUT CONTRAST TECHNIQUE: Multidetector CT imaging of the chest was performed following the standard protocol  without IV contrast. COMPARISON:  12/16/2017 CXR FINDINGS: Cardiovascular: Atherosclerosis of the great vessels with normal branch pattern. Mild to moderate aortic atherosclerosis without aneurysm. Heart is mildly enlarged without pericardial effusion. There is left main and three-vessel coronary arteriosclerosis. Pulmonary vasculature is unremarkable for this unenhanced study. Mediastinum/Nodes: Normal thyroid gland without focal mass. Patent trachea and mainstem bronchi. Unremarkable CT appearance of the esophagus. No mediastinal or hilar lymphadenopathy given limitations of a noncontrast study. Lungs/Pleura: Patchy pneumonic consolidation and atelectasis is seen in the left lower lobe with faint ground-glass opacities in the left upper lobe and lingula. Minimal  atelectasis is seen in the right lower lobe. There is mild bilateral bronchiectasis and bronchiolectasis. Upper Abdomen: Slight surface nodularity of the liver compatible with morphologic changes of cirrhosis. No space-occupying mass nor biliary dilatation. No acute abnormality in the upper abdomen. Musculoskeletal: No chest wall mass or suspicious bone lesions identified. IMPRESSION: 1. Left lower lobe pulmonary consolidations compatible with aspiration pneumonia given reported history. Lesser degree of faint ground-glass opacities are noted in the left upper lobe and lingula. 2. There is bilateral bronchiectasis and bronchiolectasis. 3. Cardiomegaly with coronary arteriosclerosis. 4. Aortic atherosclerosis. Aortic Atherosclerosis (ICD10-I70.0). Electronically Signed   By: Tollie Eth M.D.   On: 12/16/2017 19:58   Dg Chest Portable 1 View  Result Date: 12/16/2017 CLINICAL DATA:  Generalized weakness. EXAM: PORTABLE CHEST 1 VIEW COMPARISON:  12/16/2017 FINDINGS: Patient rotated left. Mild cardiomegaly with transverse aortic atherosclerosis. No right and no definite left pleural effusion. No pneumothorax. Biapical pleural thickening. medial right lung base and possible developing left base airspace disease. IMPRESSION: Hyperinflation with development of right and possible left base airspace disease. Most likely atelectasis on this AP portable radiograph. Early pneumonia or aspiration could look similar. Cardiomegaly.  Aortic Atherosclerosis (ICD10-I70.0). Electronically Signed   By: Jeronimo Greaves M.D.   On: 12/16/2017 17:10        Scheduled Meds: . brimonidine  1 drop Both Eyes BID   And  . timolol  1 drop Both Eyes BID  . enoxaparin (LOVENOX) injection  30 mg Subcutaneous Q24H  . latanoprost  1 drop Both Eyes QHS  . mouth rinse  15 mL Mouth Rinse BID  . metoprolol tartrate  5 mg Intravenous Q6H   Continuous Infusions: . ampicillin-sulbactam (UNASYN) IV 1.5 g (12/17/17 1037)     LOS: 1 day     Time spent:   Zannie Cove, MD [facility] Page via [email or web address], password [email or web address] After 7PM please contact night-coverage  12/17/2017, 12:42 PM

## 2017-12-17 NOTE — Evaluation (Signed)
Clinical/Bedside Swallow Evaluation Patient Details  Name: Natalie Holt MRN: [id number] Date of Birth: 05/06/1923  Today's Date: 12/17/2017 Time: SLP Start Time (ACUTE ONLY): 0906 SLP Stop Time (ACUTE ONLY): 0931 SLP Time Calculation (min) (ACUTE ONLY): 25 min  Past Medical History:  Past Medical History:  Diagnosis Date  . Glaucoma 2019  . Pregnancy 1950   G4 P4004   Past Surgical History:  Past Surgical History:  Procedure Laterality Date  . TONSILLECTOMY     as a child   HPI:  Natalie RichardsViola P Raven is a 84 y.o. female with medical history of glaucoma who has not seen a primary physician in over 50 years. During her time in the emergency department the patient was offered something to drink and a pill and she seemed to be choking on it. She appeared to be having difficulty managing her secretions and there was concern she may have aspirated prior to admission. Chest CT (3/6) revealed left lower lobe pulmonary consolidations compatible with aspiration pneumonia.    Assessment / Plan / Recommendation Clinical Impression   Pt presents with a congested cough at baseline that complicates assessment and intermittent cough and wet vocal quality with minimal ice chip trials. Pt had  strong spontaneous and cued coughing as indicated by repeated ability to expectorate secretions that SLP subsequently suctioned. Given concern for aspiration on imaging and s/s concerning for aspiration during assessment, recommend MBS (scheduled at 2:30 today) to objectively assess pt's oropharyngeal swallow function and safe diet recommendations. In the mean time continue NPO status.  SLP Visit Diagnosis: Dysphagia, unspecified (R13.10)    Aspiration Risk  Moderate aspiration risk    Diet Recommendation NPO   Medication Administration: Via alternative means    Other  Recommendations Oral Care Recommendations: Oral care QID Other Recommendations: Have oral suction available   Follow up Recommendations Other  (comment)(tbd)      Frequency and Duration            Prognosis Prognosis for Safe Diet Advancement: Good      Swallow Study   General HPI: Natalie RichardsViola P Bussa is a 84 y.o. female with medical history of glaucoma who has not seen a primary physician in over 50 years. During her time in the emergency department the patient was offered something to drink and a pill and she seemed to be choking on it. She appeared to be having difficulty managing her secretions and there was concern she may have aspirated prior to admission. Chest CT (3/6) revealed left lower lobe pulmonary consolidations compatible with aspiration pneumonia.  Type of Study: Bedside Swallow Evaluation Previous Swallow Assessment: none in chart Diet Prior to this Study: NPO Temperature Spikes Noted: No Respiratory Status: Nasal cannula History of Recent Intubation: No Behavior/Cognition: Alert;Cooperative;Pleasant mood Oral Cavity Assessment: Excessive secretions Oral Care Completed by SLP: Yes Oral Cavity - Dentition: Dentures, top;Adequate natural dentition(adequate natural dentition on bottom ) Patient Positioning: Upright in bed Baseline Vocal Quality: Wet Volitional Cough: Congested;Wet;Strong Volitional Swallow: Able to elicit    Oral/Motor/Sensory Function Overall Oral Motor/Sensory Function: Within functional limits   Ice Chips Ice chips: Impaired Presentation: Spoon Pharyngeal Phase Impairments: Cough - Immediate;Wet Vocal Quality   Thin Liquid Thin Liquid: Not tested    Nectar Thick Nectar Thick Liquid: Not tested   Honey Thick Honey Thick Liquid: Not tested   Puree Puree: Not tested   Solid   GO   Solid: Not tested        [place] Braylen Denunzio 12/17/2017,11:26 AM  [place] Laiyla Slagel SLP Student Clinician

## 2017-12-17 NOTE — Progress Notes (Signed)
PT Cancellation Note  Patient Details Name: Norvel RichardsViola P Zerby MRN: [id number] DOB: 10/12/1923   Cancelled Treatment:    Reason Eval/Treat Not Completed: Patient at procedure or test/unavailable. Pt off of the floor for MBS. PT will continue to f/u with pt as available.    Alessandra BevelsJennifer M Allaina Brotzman 12/17/2017, 2:47 PM

## 2017-12-18 DIAGNOSIS — L8990 Pressure ulcer of unspecified site, unspecified stage: Secondary | ICD-10-CM

## 2017-12-18 LAB — BASIC METABOLIC PANEL
Anion gap: 10 (ref 5–15)
BUN: 23 mg/dL — ABNORMAL HIGH (ref 6–20)
CALCIUM: 8.9 mg/dL (ref 8.9–10.3)
CO2: 22 mmol/L (ref 22–32)
CREATININE: 1.16 mg/dL — AB (ref 0.44–1.00)
Chloride: 111 mmol/L (ref 101–111)
GFR calc Af Amer: 46 mL/min — ABNORMAL LOW (ref >60)
GFR calc non Af Amer: 39 mL/min — ABNORMAL LOW (ref >60)
GLUCOSE: 95 mg/dL (ref 65–99)
Potassium: 3.9 mmol/L (ref 3.5–5.1)
Sodium: 143 mmol/L (ref 135–145)

## 2017-12-18 LAB — CBC
HCT: 34 % — ABNORMAL LOW (ref 36.0–46.0)
Hemoglobin: 11.3 g/dL — ABNORMAL LOW (ref 12.0–15.0)
MCH: 30.6 pg (ref 26.0–34.0)
MCHC: 33.2 g/dL (ref 30.0–36.0)
MCV: 92.1 fL (ref 78.0–100.0)
Platelets: 201 10*3/uL (ref 150–400)
RBC: 3.69 MIL/uL — ABNORMAL LOW (ref 3.87–5.11)
RDW: 14.9 % (ref 11.5–15.5)
WBC: 6.9 10*3/uL (ref 4.0–10.5)

## 2017-12-18 MED ORDER — SODIUM CHLORIDE 0.9 % IV BOLUS (SEPSIS)
500.0000 mL | Freq: Once | INTRAVENOUS | Status: AC
  Administered 2017-12-18: 500 mL via INTRAVENOUS

## 2017-12-18 MED ORDER — DEXTROSE-NACL 5-0.45 % IV SOLN
INTRAVENOUS | Status: DC
  Administered 2017-12-18 – 2017-12-19 (×3): via INTRAVENOUS
  Administered 2017-12-20: 1000 mL via INTRAVENOUS

## 2017-12-18 MED ORDER — METOPROLOL TARTRATE 5 MG/5ML IV SOLN
5.0000 mg | INTRAVENOUS | Status: AC
  Administered 2017-12-18: 5 mg via INTRAVENOUS
  Filled 2017-12-18: qty 5

## 2017-12-18 NOTE — Evaluation (Signed)
Physical Therapy Evaluation Patient Details Name: Natalie Holt MRN: [id number] DOB: 02/27/1923 Today's Date: 12/18/2017   History of Present Illness  89yow PMH glaucoma, not seen MD >50 yrs, presented with generalized weakness. In ED appeared well, then choked on a pill, deteriorated, developing acute hypoxic resp failure, presumed aspiration, new/sudden afib RVR. CT c/w aspiration. Admitted for aspiration pneumonia with acute hypoxic resp failure, afib RVR, dysphagia, AKI.  Clinical Impression  Pt admitted with above diagnosis. Pt currently with functional limitations due to the deficits listed below (see PT Problem List). Pt was unable to sit EOB due to hypotension. Nurse had alerted MD.  Most likely will benefit from SNF at d/c to address mobility. Will follow acutely.  Pt will benefit from skilled PT to increase their independence and safety with mobility to allow discharge to the venue listed below.      Follow Up Recommendations SNF;Supervision/Assistance - 24 hour    Equipment Recommendations  None recommended by PT    Recommendations for Other Services       Precautions / Restrictions Precautions Precautions: Fall Restrictions Weight Bearing Restrictions: No      Mobility  Bed Mobility Overal bed mobility: Needs Assistance Bed Mobility: Rolling Rolling: Supervision         General bed mobility comments: Pt can roll with rails without assist   Transfers                 General transfer comment: BP low and nurse asked PT to not get pt OOB.   Ambulation/Gait                Stairs            Wheelchair Mobility    Modified Rankin (Stroke Patients Only)       Balance                                             Pertinent Vitals/Pain Pain Assessment: No/denies pain    Home Living Family/patient expects to be discharged to:: Private residence Living Arrangements: Alone Available Help at Discharge: Family;Available  PRN/intermittently(son checks on her and shops for her) Type of Home: House Home Access: Stairs to enter Entrance Stairs-Rails: Right;Left;Can reach both Entrance Stairs-Number of Steps: 19 Home Layout: Two level Home Equipment: Cane - single point      Prior Function Level of Independence: Independent with assistive device(s)         Comments: Bathed and dressed self,      Hand Dominance        Extremity/Trunk Assessment   Upper Extremity Assessment Upper Extremity Assessment: Defer to OT evaluation    Lower Extremity Assessment Lower Extremity Assessment: Generalized weakness    Cervical / Trunk Assessment Cervical / Trunk Assessment: Normal  Communication   Communication: No difficulties  Cognition Arousal/Alertness: Awake/alert Behavior During Therapy: WFL for tasks assessed/performed Overall Cognitive Status: Within Functional Limits for tasks assessed                                        General Comments General comments (skin integrity, edema, etc.): BP on arrival 91/41.  Assisted pt up in the bed and BP changed to 84/45.  Minor movements disrupting BP.  Nurse has made MD aware.  Exercises General Exercises - Lower Extremity Ankle Circles/Pumps: AROM;Both;10 reps;Supine Heel Slides: AROM;Both;10 reps;Supine Hip ABduction/ADduction: AROM;Both;10 reps;Supine   Assessment/Plan    PT Assessment Patient needs continued PT services  PT Problem List Decreased balance;Decreased activity tolerance;Decreased mobility;Decreased knowledge of use of DME;Decreased safety awareness;Decreased knowledge of precautions;Cardiopulmonary status limiting activity       PT Treatment Interventions DME instruction;Gait training;Functional mobility training;Therapeutic activities;Therapeutic exercise;Balance training;Patient/family education    PT Goals (Current goals can be found in the Care Plan section)  Acute Rehab PT Goals Patient Stated Goal: to  get stronger PT Goal Formulation: With patient Time For Goal Achievement: 01/01/18 Potential to Achieve Goals: Good    Frequency Min 2X/week   Barriers to discharge Decreased caregiver support      Co-evaluation               AM-PAC PT "6 Clicks" Daily Activity  Outcome Measure Difficulty turning over in bed (including adjusting bedclothes, sheets and blankets)?: Unable Difficulty moving from lying on back to sitting on the side of the bed? : Unable Difficulty sitting down on and standing up from a chair with arms (e.g., wheelchair, bedside commode, etc,.)?: Unable Help needed moving to and from a bed to chair (including a wheelchair)?: Total Help needed walking in hospital room?: Total Help needed climbing 3-5 steps with a railing? : Total 6 Click Score: 6    End of Session Equipment Utilized During Treatment: Gait belt;Oxygen(3LO2) Activity Tolerance: Patient limited by fatigue(limited by decr BP) Patient left: in bed;with call bell/phone within reach;with bed alarm set Nurse Communication: Mobility status PT Visit Diagnosis: Unsteadiness on feet (R26.81);Muscle weakness (generalized) (M62.81)    Time: 1130-1140 PT Time Calculation (min) (ACUTE ONLY): 10 min   Charges:   PT Evaluation $PT Eval Moderate Complexity: 1 Mod     PT G Codes:        Darnesha Diloreto,PT Acute Rehabilitation [phone] (pager)   Berline Lopes 12/18/2017, 2:12 PM

## 2017-12-18 NOTE — Progress Notes (Addendum)
Patient is Alert and Oriented.  No Distress.  Per Notes: admitted with aspiration PNA.  Per Speech Therapy continue NPO status.  BP 80/39, manual correlates with automatic.  HR 70-80.  RR 20  O2 sats 96% on 3L [place].  Lung sounds decreased bases.  NS bolus infusing slowing over 2 hours per orders.  Dr Irene LimboGoodrich at bedside to assess patient.    1500  BP 107/46  HR 89 1900 BP 127/58  HR 88

## 2017-12-18 NOTE — Progress Notes (Signed)
  Speech Language Pathology Treatment: Dysphagia  Patient Details Name: Natalie Holt MRN: [id number] DOB: 07/02/1923 Today's Date: 12/18/2017 Time: [id number] SLP Time Calculation (min) (ACUTE ONLY): 31 min  Assessment / Plan / Recommendation Clinical Impression  Pt seen today to initiate swallow exercise protocol. Pt performed hard swallow with use of minimal thin liquid from swab. Pt swallowed liquid without overt s/s concerning for aspiration; however, given MBS yesterday, pt is at hish risk for silent aspiration so no further PO trials attempted this date. CTAR and CTAR/hold was  introduced and practiced with pt completing 2 rounds of 10. EMST was also introduced, with pt having completing 3 rounds of 5 with trainer set at least resistance (5 cm H2O) but pt still not able to generate enough pressure to completely perform despite Mod A from SLP to assist with adequate lip seal and place nasal clips. SLP reviewed swallow study with pt and son and instructed pt to practice exercises throughout day to encourage stronger cough and more efficient swallow. SLP continues to recommend NPO status. Conversation likely needed to discuss pt's GOC regarding alternative means of nutrition. Per son, pt has verbalized that she is "ready to die." SLP will continue to follow to reinforce swallowing exercises.     HPI HPI: Natalie Holt is a 84 y.o. female with medical history of glaucoma who has not seen a primary physician in over 50 years. During her time in the emergency department the patient was offered something to drink and a pill and she seemed to be choking on it. She appeared to be having difficulty managing her secretions and there was concern she may have aspirated prior to admission. Chest CT (3/6) revealed left lower lobe pulmonary consolidations compatible with aspiration pneumonia.       SLP Plan  Continue with current plan of care       Recommendations  Diet recommendations: NPO Medication  Administration: Via alternative means                Oral Care Recommendations: Oral care QID Follow up Recommendations: Other (comment)(tbd) SLP Visit Diagnosis: Dysphagia, oropharyngeal phase (R13.12) Plan: Continue with current plan of care       GO              [place] Zarianna Dicarlo SLP Student Clinician   [place] Trevel Dillenbeck 12/18/2017, 10:22 AM

## 2017-12-18 NOTE — Progress Notes (Signed)
PROGRESS NOTE  Natalie RichardsViola P Birch BJY:[id number] DOB: 08/28/1923 DOA: 12/16/2017 PCP: Patient, No Pcp Per  Brief Narrative: 89yow PMH glaucoma, not seen MD >50 yrs, presented with generalized weakness. In ED appeared well, then choked on a pill, deteriorated, developing acute hypoxic resp failure, presumed aspiration, new/sudden afib RVR. CT c/w aspiration. Admitted for aspiration pneumonia with acute hypoxic resp failure, afib RVR, dysphagia, AKI.  Assessment/Plan Aspiration pneumonia with acute hypoxic respiratory failure --afebrile, normal WBC, clinically appears to be improving --continue IV abx, oxygen  Atrial fibrillation with RVR, likely triggered by aspiration --currently in SR. Strategy will be rate-control. --ASA. Based on GOC anticoagulation is not appropriate --pt has elected conservative management, thus echo not ordered  Dysphagia --NPO per ST. Failed MBS. If GOC are aggressive will need alternative nutrition next 48 hours if fails to improve, otherwise comfort feeds  Hypotension --does not appear septic, perfusing well, normal pulses, mentating normally, BP low overnight; per RN NPO since admission with minimal IVF. Favor relative hypovolemia.  --bolus IVF, monitor BP  AKI vs CKD --probably CKD stage III; AKI has resolved  Murmur. --syspect aortic stenosis  Cardiomegaly, CAD on CT chest --pt has elected conservative management   Pt desires conservative management, DNR, will honor. Continue abx, volume resuscitate, trend labs. Will consult PMT to assist with GOC as nutrition appears to be an issue. Keep NPO today.  DVT prophylaxis: enoxaparin Code Status: DNR Family Communication:  Disposition Plan: pending    Brendia Sacksaniel Goodrich, MD  [facility] Direct contact: [phone] --Via amion app OR  --[email or web address]; password [email or web address]  7PM-7AM contact night coverage as above 12/18/2017, 12:51 PM  LOS: 2 days    Consultants:  PMT  Procedures:    Antimicrobials:  Unasyn 3/7 >>  Interval history/Subjective: RN reported hypotension, rapid HR up to 130s. Pt asymptomatic.  Currently in SR. Patient awake, alert, denies pain, SOB.  Objective: Vitals:  Vitals:   12/18/17 1229 12/18/17 1230  BP: (!) 80/39 (!) 85/39  Pulse: 81 76  Resp: 18 (!) 24  Temp: 97.9 F (36.6 C)   SpO2: 96% 96%    Exam:  Constitutional:  . Appears calm and comfortable Eyes:  . pupils and irises appear normal . Normal lids   ENMT:  . grossly normal hearing  . Lips appear normal Respiratory:  . CTA bilaterally, no w/r/r.  . Respiratory effort normal Cardiovascular:  . RRR, 3/6 holosystolic murmur, no r/g . No LE extremity edema   . Normal bilateral pedal pulses Abdomen:  . soft Psychiatric:  . Mental status o Mood, affect appropriate o Orientation to person, hospital, 2019   I have personally reviewed the following:   Labs:  Creatinine 1.5 > 1.16  Hgb stable 11.3, WBC stable 6.9  Imaging studies:  CT chest noted LLL aspiration pneumonia, bilateral bronchiectasis   Scheduled Meds: . brimonidine  1 drop Both Eyes BID   And  . timolol  1 drop Both Eyes BID  . enoxaparin (LOVENOX) injection  30 mg Subcutaneous Q24H  . latanoprost  1 drop Both Eyes QHS  . mouth rinse  15 mL Mouth Rinse BID  . metoprolol tartrate  5 mg Intravenous Q6H   Continuous Infusions: . ampicillin-sulbactam (UNASYN) IV Stopped (12/18/17 0957)  . dextrose 5 % and 0.45% NaCl    . sodium chloride 500 mL (12/18/17 1127)  . sodium chloride      Principal Problem:   Aspiration pneumonia (HCC) Active Problems:   Atrial fibrillation with RVR (HCC)  Dysphagia   Elevated random blood glucose level   Acute kidney injury superimposed on CKD (HCC)   DNR (do not resuscitate) discussion   Bronchiectasis (HCC)   Coronary atherosclerosis due to calcified coronary lesion of native artery   Cardiomegaly   Acute  respiratory failure with hypoxemia (HCC)   Pressure injury of skin   LOS: 2 days

## 2017-12-19 LAB — CBC
HEMATOCRIT: 31.3 % — AB (ref 36.0–46.0)
HEMOGLOBIN: 10.4 g/dL — AB (ref 12.0–15.0)
MCH: 30.5 pg (ref 26.0–34.0)
MCHC: 33.2 g/dL (ref 30.0–36.0)
MCV: 91.8 fL (ref 78.0–100.0)
Platelets: 192 10*3/uL (ref 150–400)
RBC: 3.41 MIL/uL — ABNORMAL LOW (ref 3.87–5.11)
RDW: 14.7 % (ref 11.5–15.5)
WBC: 7.1 10*3/uL (ref 4.0–10.5)

## 2017-12-19 LAB — BASIC METABOLIC PANEL
ANION GAP: 10 (ref 5–15)
BUN: 17 mg/dL (ref 6–20)
CALCIUM: 7.9 mg/dL — AB (ref 8.9–10.3)
CO2: 19 mmol/L — AB (ref 22–32)
Chloride: 111 mmol/L (ref 101–111)
Creatinine, Ser: 0.95 mg/dL (ref 0.44–1.00)
GFR calc Af Amer: 58 mL/min — ABNORMAL LOW (ref >60)
GFR calc non Af Amer: 50 mL/min — ABNORMAL LOW (ref >60)
GLUCOSE: 153 mg/dL — AB (ref 65–99)
POTASSIUM: 3.1 mmol/L — AB (ref 3.5–5.1)
Sodium: 140 mmol/L (ref 135–145)

## 2017-12-19 LAB — GLUCOSE, CAPILLARY: Glucose-Capillary: 117 mg/dL — ABNORMAL HIGH (ref 65–99)

## 2017-12-19 MED ORDER — SODIUM CHLORIDE 0.9 % IV SOLN
1.5000 g | Freq: Four times a day (QID) | INTRAVENOUS | Status: DC
  Administered 2017-12-19 – 2017-12-20 (×4): 1.5 g via INTRAVENOUS
  Filled 2017-12-19 (×6): qty 1.5

## 2017-12-19 MED ORDER — ENOXAPARIN SODIUM 40 MG/0.4ML ~~LOC~~ SOLN
40.0000 mg | SUBCUTANEOUS | Status: DC
  Administered 2017-12-19: 40 mg via SUBCUTANEOUS
  Filled 2017-12-19: qty 0.4

## 2017-12-19 NOTE — Progress Notes (Signed)
Pharmacy Antibiotic Note  Natalie Holt is a 84 y.o. female admitted on 12/16/2017 with pneumonia.  Pharmacy has been consulted for Unasyn dosing.  Renal function has improved with SCr down to 0.95, CrCl~35 ml/min. Will adjust the Unasyn dose today.  Plan: Adjust Unasyn to 1.5g IV q6h Monitor renal function and adjust as needed  Height: 5\' 7"  (170.2 cm) Weight: 142 lb 3.2 oz (64.5 kg) IBW/kg (Calculated) : 61.6  Temp (24hrs), Avg:97.9 F (36.6 C), Min:97.5 F (36.4 C), Max:98.3 F (36.8 C)  Recent Labs  Lab 12/16/17 1538 12/16/17 2100 12/17/17 0249 12/18/17 0451 12/19/17 0236  WBC 12.0* 5.3 6.0 6.9 7.1  CREATININE 1.50* 1.37* 1.16* 1.16* 0.95    Estimated Creatinine Clearance: 36 mL/min (by C-G formula based on SCr of 0.95 mg/dL).    No Known Allergies  Antimicrobials this admission: Ceftriaxone 3/5 Doxy 3/5 >> 3/6 Unasyn 3/6>>  Microbiology results: 3/5 Blood >> ngtd  Thank you for allowing pharmacy to be a part of this patient's care.  Georgina PillionElizabeth Diamonique Ruedas, PharmD, BCPS Clinical Pharmacist Pager: [phone] Clinical phone for 12/19/2017 from 7a-3:30p: [phone] If after 3:30p, please call main pharmacy at: [phone] 12/19/2017 9:29 AM

## 2017-12-19 NOTE — Progress Notes (Addendum)
  Speech Language Pathology Treatment: Dysphagia  Patient Details Name: Natalie Holt MRN: [id number] DOB: 11/18/1922 Today's Date: 12/19/2017 Time: [id number] SLP Time Calculation (min) (ACUTE ONLY): 16 min  Assessment / Plan / Recommendation Clinical Impression  Pt seen for continued practice with swallow exercises and PO trials with compensatory strategies. Pt practiced CTAR and CTAR/hold for 2 rounds of 10 reps, with reduced chin pressure at the end secondary to fatigue with Mod I. Pt continues to have difficulty generating sufficient pressure for EMST despite trainer's setting at lowest pressure (5 cm H2O); however, pt able to replicate effortful exhale with Mod verbal/visual cues. Pt trialed with ice chips and thin liquid provided via swab using compensatory strategies (holding liquid in anterior of mouth and then hard swallow) to circumvent premature spillage. Pt appeared to have more success using swab vs ice, as indicated by reduced wet vocal quality using swab. Pt benefited from min verbal cues throughout session to cough/clear throat. Given recent MD note indicating transition to comfort feeds and pt's verbal refusal for alternative means of nutrition, question if repeat objective test is warranted to determine safest diet versus initiation of comfort feeds. SLP will follow up for further intervention with GOC pending.   HPI HPI: Natalie Holt is a 84 y.o. female with medical history of glaucoma who has not seen a primary physician in over 50 years. During her time in the emergency department the patient was offered something to drink and a pill and she seemed to be choking on it. She appeared to be having difficulty managing her secretions and there was concern she may have aspirated prior to admission. Chest CT (3/6) revealed left lower lobe pulmonary consolidations compatible with aspiration pneumonia.       SLP Plan  Continue with current plan of care       Recommendations  Diet  recommendations: NPO(comfort feed per MD) Medication Administration: Via alternative means                Oral Care Recommendations: Oral care QID Follow up Recommendations: Skilled Nursing facility SLP Visit Diagnosis: Dysphagia, oropharyngeal phase (R13.12) Plan: Continue with current plan of care       GO              [place] Saoirse Legere SLP Student Clinician   [place] Kewon Statler 12/19/2017, 1:39 PM

## 2017-12-19 NOTE — Progress Notes (Addendum)
Pt c/o dry mouth, given water with swabs for comfort and reduction of aspiration risk.  1748: MD Jomarie LongsJoseph paged for need for foley, patient bladder scan 450

## 2017-12-19 NOTE — Progress Notes (Signed)
Palliative Medicine consult noted. Due to high referral volume, there may be a delay seeing this patient. Please call the Palliative Medicine Team office at [phone] if recommendations are needed in the interim.  Thank you for inviting us to see this patient.  Margret ChanceMelanie G. Raydin Bielinski, RN, BSN, [facility] Palliative Medicine Team 12/19/2017 12:55 PM Office [phone]

## 2017-12-19 NOTE — Progress Notes (Signed)
Physical Therapy Treatment Patient Details Name: Natalie Holt MRN: [id number] DOB: [birth year] Today's Date: 12/19/2017    History of Present Illness Pt is a 84 y/o female with PMH glaucoma, not seen MD >50 yrs, presented with generalized weakness. In ED appeared well, then choked on a pill, deteriorated, developing acute hypoxic resp failure, presumed aspiration, new/sudden afib RVR. CT c/w aspiration. Admitted for aspiration pneumonia with acute hypoxic resp failure, afib RVR, dysphagia, AKI.    PT Comments    Pt making fair progress with functional mobility. Pt able to achieve sitting EOB with min guard for safety. BP stable this session. Pt declining further mobility at this time. Pt would continue to benefit from skilled physical therapy services at this time while admitted and after d/c to address the below listed limitations in order to improve overall safety and independence with functional mobility.    Follow Up Recommendations  SNF;Supervision/Assistance - 24 hour     Equipment Recommendations  None recommended by PT    Recommendations for Other Services       Precautions / Restrictions Precautions Precautions: Fall Precaution Comments: watch BP Restrictions Weight Bearing Restrictions: No    Mobility  Bed Mobility Overal bed mobility: Needs Assistance Bed Mobility: Supine to Sit;Sit to Supine     Supine to sit: Min guard Sit to supine: Min guard   General bed mobility comments: increased time and effort, use of rails, min guard for safety  Transfers                 General transfer comment: pt only agreeable to sit EOB  Ambulation/Gait                 Stairs            Wheelchair Mobility    Modified Rankin (Stroke Patients Only)       Balance Overall balance assessment: Needs assistance Sitting-balance support: Feet supported Sitting balance-Leahy Scale: Fair Sitting balance - Comments: pt able to sit EOB with supervision  without LOB                                     Cognition Arousal/Alertness: Awake/alert Behavior During Therapy: WFL for tasks assessed/performed Overall Cognitive Status: Within Functional Limits for tasks assessed                                        Exercises      General Comments General comments (skin integrity, edema, etc.): BP in supine at beginning of session = 113/59; BP in sitting = 114/63      Pertinent Vitals/Pain Pain Assessment: No/denies pain    Home Living                      Prior Function            PT Goals (current goals can now be found in the care plan section) Acute Rehab PT Goals PT Goal Formulation: With patient Time For Goal Achievement: 01/01/18 Potential to Achieve Goals: Good Progress towards PT goals: Progressing toward goals    Frequency    Min 2X/week      PT Plan Current plan remains appropriate    Co-evaluation              AM-PAC PT "  6 Clicks" Daily Activity  Outcome Measure  Difficulty turning over in bed (including adjusting bedclothes, sheets and blankets)?: A Little Difficulty moving from lying on back to sitting on the side of the bed? : A Little Difficulty sitting down on and standing up from a chair with arms (e.g., wheelchair, bedside commode, etc,.)?: Unable Help needed moving to and from a bed to chair (including a wheelchair)?: A Little Help needed walking in hospital room?: Total Help needed climbing 3-5 steps with a railing? : Total 6 Click Score: 12    End of Session Equipment Utilized During Treatment: Oxygen Activity Tolerance: Patient limited by fatigue Patient left: in bed;with call bell/phone within reach;with bed alarm set Nurse Communication: Mobility status PT Visit Diagnosis: Unsteadiness on feet (R26.81);Muscle weakness (generalized) (M62.81)     Time: [id number] PT Time Calculation (min) (ACUTE ONLY): 14 min  Charges:  $Therapeutic Activity:  8-22 mins                    G Codes:       CableJennifer Sophee Mckimmy, [place]PT, [place]PT [phone]    Natalie Holt 12/19/2017, 3:55 PM

## 2017-12-19 NOTE — Clinical Social Work Note (Signed)
Clinical Social Work Assessment  Patient Details  Name: Natalie Holt MRN: [id number] Date of Birth: 12/30/1922  Date of referral:  12/19/17               Reason for consult:  Facility Placement                Permission sought to share information with:  Oceanographeracility Contact Representative Permission granted to share information::  Yes, Verbal Permission Granted  Name::     Magazine features editorGuy  Agency::  SNF  Relationship::  son  Contact Information:     Housing/Transportation Living arrangements for the past 2 months:  Apartment Source of Information:  Patient, Adult Children Patient Interpreter Needed:  None Criminal Activity/Legal Involvement Pertinent to Current Situation/Hospitalization:  No - Comment as needed Significant Relationships:  Adult Children Lives with:  Self Do you feel safe going back to the place where you live?  No Need for family participation in patient care:  Yes (Comment)(help with decision making)  Care giving concerns:  Pt lives in apartment alone.  Pt states she has 19 stairs to climb to get into apartment which she does not think she can manage any more- does not think she'll be able to return home.   Social Worker assessment / plan:  CSW spoke with pt concerning PT recommendation for SNF.  CSW explained SNF and SNF referral process.  Pt gave permission for CSW to call pt son to discuss further and states he helps her with decision making.  CSW spoke with son who reveals patient has not been eating and is talking about wanting to be EOL.  CSW discussed that palliative care is consulted and that they will follow up with them on GOC and how they would like to proceed if pt continues to be unable to eat or drink.  Employment status:  Retired Health and safety inspectornsurance information:  Medicare PT Recommendations:  Skilled Nursing Facility Information / Referral to community resources:  Skilled Nursing Facility  Patient/Family's Response to care:  Pt agreeable to SNF if needed but appears as  if she would prefer more comfort oriented measures- pt son anxious to discuss GOC with physician.  Patient/Family's Understanding of and Emotional Response to Diagnosis, Current Treatment, and Prognosis:  Pt and family seem to have good understanding of condition and of pt limitations for recovery at her age.  Emotional Assessment Appearance:  Appears stated age Attitude/Demeanor/Rapport:    Affect (typically observed):  Accepting, Appropriate Orientation:  Oriented to Self, Oriented to Place, Oriented to  Time, Oriented to Situation Alcohol / Substance use:  Not Applicable Psych involvement (Current and /or in the community):  No (Comment)  Discharge Needs  Concerns to be addressed:  Care Coordination Readmission within the last 30 days:  No Current discharge risk:  Physical Impairment, Lives alone Barriers to Discharge:  Continued Medical Work up   Burna SisUris, Natalie Orris H, LCSW 12/19/2017, 10:59 AM

## 2017-12-19 NOTE — NC FL2 (Signed)
[place] MEDICAID FL2 LEVEL OF CARE SCREENING TOOL     IDENTIFICATION  Patient Name: Natalie Holt Birthdate: [birth year] Sex: female Admission Date (Current Location): 12/16/2017  [facility] and [place] Number:  Producer, television/film/video and Address:  [facility] [place], [address] [place], [place] [address]      Provider Number: [id number]  Attending Physician Name and Address:  Zannie Cove, MD  Relative Name and Phone Number:       Current Level of Care: SNF Recommended Level of Care: Skilled Nursing Facility Prior Approval Number:    Date Approved/Denied:   PASRR Number: [id number]  Discharge Plan: SNF    Current Diagnoses: Patient Active Problem List   Diagnosis Date Noted  . Pressure injury of skin 12/18/2017  . Aspiration pneumonia (HCC) 12/16/2017  . Atrial fibrillation with RVR (HCC) 12/16/2017  . Dysphagia 12/16/2017  . Elevated random blood glucose level 12/16/2017  . Acute kidney injury superimposed on CKD (HCC) 12/16/2017  . DNR (do not resuscitate) discussion 12/16/2017  . Bronchiectasis (HCC) 12/16/2017  . Coronary atherosclerosis due to calcified coronary lesion of native artery 12/16/2017  . Cardiomegaly 12/16/2017  . Acute respiratory failure with hypoxemia (HCC) 12/16/2017    Orientation RESPIRATION BLADDER Height & Weight     Self, Time, Situation, Place  Normal Continent Weight: 142 lb 3.2 oz (64.5 kg) Height:  5\' 7"  (170.2 cm)  BEHAVIORAL SYMPTOMS/MOOD NEUROLOGICAL BOWEL NUTRITION STATUS      Continent Diet(see DC summary)  AMBULATORY STATUS COMMUNICATION OF NEEDS Skin   Extensive Assist Verbally PU Stage and Appropriate Care PU Stage 1 Dressing: (foam dressing PRN)                     Personal Care Assistance Level of Assistance  Bathing, Dressing Bathing Assistance: Maximum assistance   Dressing Assistance: Maximum assistance     Functional Limitations Info             SPECIAL CARE FACTORS FREQUENCY   PT (By licensed PT), OT (By licensed OT)     PT Frequency: 5/wk OT Frequency: 5/wk            Contractures      Additional Factors Info  Code Status, Allergies Code Status Info: DNR Allergies Info: NKA           Current Medications (12/19/2017):  This is the current hospital active medication list Current Facility-Administered Medications  Medication Dose Route Frequency Provider Last Rate Last Dose  . ampicillin-sulbactam (UNASYN) 1.5 g in sodium chloride 0.9 % 100 mL IVPB  1.5 g Intravenous Q12H Sallee Provencal, RPH 200 mL/hr at 12/19/17 1033 1.5 g at 12/19/17 1033  . brimonidine (ALPHAGAN) 0.2 % ophthalmic solution 1 drop  1 drop Both Eyes BID Lahoma Crocker, MD   1 drop at 12/19/17 1033   And  . timolol (TIMOPTIC) 0.5 % ophthalmic solution 1 drop  1 drop Both Eyes BID Lahoma Crocker, MD   1 drop at 12/19/17 1034  . dextrose 5 %-0.45 % sodium chloride infusion   Intravenous Continuous Zannie Cove, MD 75 mL/hr at 12/19/17 0758    . enoxaparin (LOVENOX) injection 30 mg  30 mg Subcutaneous Q24H Lahoma Crocker, MD   30 mg at 12/18/17 2224  . latanoprost (XALATAN) 0.005 % ophthalmic solution 1 drop  1 drop Both Eyes QHS Lahoma Crocker, MD   1 drop at 12/18/17 2224  . MEDLINE mouth rinse  15 mL Mouth Rinse BID Lahoma CrockerSheehan, Theresa C, MD   15 mL at 12/19/17 1034  . metoprolol tartrate (LOPRESSOR) injection 5 mg  5 mg Intravenous Q6H Zannie CoveJoseph, Preetha, MD   5 mg at 12/19/17 [id number]     Discharge Medications: Please see discharge summary for a list of discharge medications.  Relevant Imaging Results:  Relevant Lab Results:   Additional Information SS#: [id number]  Burna SisUris, Tyleek Smick H, LCSW

## 2017-12-19 NOTE — Progress Notes (Signed)
  PROGRESS NOTE  Natalie RichardsViola P Klingerman ZOX:[id number] DOB: 08/16/1923 DOA: 12/16/2017 PCP: Patient, No Pcp Per  Brief Narrative: 89yow PMH glaucoma, not seen MD >50 yrs, presented with generalized weakness. In ED appeared well, then choked on a pill, deteriorated, developing acute hypoxic resp failure, presumed aspiration, new/sudden afib RVR. CT c/w aspiration. Admitted for aspiration pneumonia with acute hypoxic resp failure, afib RVR, dysphagia, AKI.  Assessment/Plan  Aspiration pneumonia with acute hypoxic respiratory failure --afebrile, normal WBC, clinically appears to be improving --continue IV Unasyn, oxygen -SLP eval completed, severe aspiration risk -Palliative consulted  Atrial fibrillation with RVR, likely triggered by aspiration --currently in SR. Strategy will be rate-control. --ASA. Based on GOC anticoagulation is not appropriate --pt has elected conservative management, thus echo not ordered  Dysphagia --NPO per ST. Failed MBS --palliative consult pending - per discussion with pt she wouldn't want PEG or NG tube, comfort feeds for now, will contact son latertoday  AKI vs CKD --probably CKD stage III; AKI has resolved  Murmur. --syspect aortic stenosis  Cardiomegaly, CAD on CT chest --pt has elected conservative management   Pt desires conservative management, DNR, will honor. Continue abx, volume resuscitate, trend labs. Will consult PMT to assist with GOC as nutrition appears to be an issue. Ok to have water, await further discussion with son regarding nutrition and Palliative medicine  DVT prophylaxis: enoxaparin Code Status: DNR Family Communication: no family at bedside Disposition Plan: pending   Zannie CovePreetha Laurance Heide, MD  [facility] Page via [email or web address] [email or web address]; password [email or web address]  7PM-7AM contact night coverage as above 12/19/2017, 11:23 AM  LOS: 3 days   Consultants:  PMT  Procedures:    Antimicrobials:  Unasyn 3/7 >>  Interval history/Subjective: RN reported  hypotension, rapid HR up to 130s. Pt asymptomatic.  Currently in SR. Patient awake, alert, denies pain, SOB.  Objective: Vitals:  Vitals:   12/19/17 0400 12/19/17 0730  BP: (!) 149/62 (!) 149/60  Pulse: 93 99  Resp: (!) 23 (!) 23  Temp:    SpO2: 97% 96%    Exam: Gen: Awake, Alert, Oriented X 2 HEENT: PERRLA, Neck supple, no JVD Lungs: ronchi at R base CVS: S1S2/RRR AS murmur Abd: soft, Non tender, non distended, BS present Extremities: No Cyanosis, Clubbing or edema Skin: no new rashes  I have personally reviewed the following:   Labs:   Imaging studies:  CT chest noted LLL aspiration pneumonia, bilateral bronchiectasis   Scheduled Meds: . brimonidine  1 drop Both Eyes BID   And  . timolol  1 drop Both Eyes BID  . enoxaparin (LOVENOX) injection  30 mg Subcutaneous Q24H  . latanoprost  1 drop Both Eyes QHS  . mouth rinse  15 mL Mouth Rinse BID  . metoprolol tartrate  5 mg Intravenous Q6H   Continuous Infusions: . ampicillin-sulbactam (UNASYN) IV 1.5 g (12/19/17 1033)  . dextrose 5 % and 0.45% NaCl 75 mL/hr at 12/19/17 [id number]    Principal Problem:   Aspiration pneumonia (HCC) Active Problems:   Atrial fibrillation with RVR (HCC)   Dysphagia   Elevated random blood glucose level   Acute kidney injury superimposed on CKD (HCC)   DNR (do not resuscitate) discussion   Bronchiectasis (HCC)   Coronary atherosclerosis due to calcified coronary lesion of native artery   Cardiomegaly   Acute respiratory failure with hypoxemia (HCC)   Pressure injury of skin   LOS: 3 days

## 2017-12-20 DIAGNOSIS — Z7189 Other specified counseling: Secondary | ICD-10-CM

## 2017-12-20 DIAGNOSIS — J690 Pneumonitis due to inhalation of food and vomit: Principal | ICD-10-CM

## 2017-12-20 DIAGNOSIS — R1310 Dysphagia, unspecified: Secondary | ICD-10-CM

## 2017-12-20 DIAGNOSIS — J470 Bronchiectasis with acute lower respiratory infection: Secondary | ICD-10-CM

## 2017-12-20 DIAGNOSIS — Z515 Encounter for palliative care: Secondary | ICD-10-CM

## 2017-12-20 DIAGNOSIS — I517 Cardiomegaly: Secondary | ICD-10-CM

## 2017-12-20 LAB — MRSA PCR SCREENING: MRSA by PCR: NEGATIVE

## 2017-12-20 MED ORDER — METOPROLOL TARTRATE 25 MG PO TABS: 25.0000 mg | ORAL_TABLET | Freq: Two times a day (BID) | ORAL | Status: DC

## 2017-12-20 MED ORDER — MORPHINE SULFATE (PF) 2 MG/ML IV SOLN
1.0000 mg | INTRAVENOUS | Status: DC | PRN
  Administered 2017-12-20: 1 mg via INTRAVENOUS
  Filled 2017-12-20: qty 1

## 2017-12-20 MED ORDER — GLYCOPYRROLATE 0.2 MG/ML IJ SOLN: 0.2000 mg | INTRAMUSCULAR | Status: DC | PRN

## 2017-12-20 MED ORDER — METOPROLOL TARTRATE 25 MG PO TABS
25.0000 mg | ORAL_TABLET | Freq: Two times a day (BID) | ORAL | Status: DC
  Administered 2017-12-20: 25 mg via ORAL
  Filled 2017-12-20: qty 1

## 2017-12-20 MED ORDER — AMOXICILLIN-POT CLAVULANATE 875-125 MG PO TABS: 1.0000 | ORAL_TABLET | Freq: Two times a day (BID) | ORAL | 0 refills | Status: DC

## 2017-12-20 NOTE — Consult Note (Signed)
Consultation Note Date: 12/20/2017   Patient Name: Natalie Holt  DOB: 04-19-1923  MRN: [id number]  Age / Sex: 84 y.o., female  PCP: Patient, No Pcp Per Referring Physician: Zannie Cove, MD  Reason for Consultation: Establishing goals of care, Non pain symptom management, Pain control and Psychosocial/spiritual support  HPI/Patient Profile: 84 y.o. female  with past medical history of glaucoma, atrial fib with RVR (this is a new finding), dysphasia admitted on 12/16/2017 with weakness, hypoxic respiratory failure presumed to be aspiration pneumonia.  Per chest CT,: Left lower lobe aspiration pneumonia, bilateral bronchiectasis. Patient has not seen a physician in 50 years.  Consult ordered for goals of care.   Clinical Assessment and Goals of Care: Patient seen, chart reviewed.  Patient is currently a DNR.   Marland Kitchen  She has severe aspiration risk but per chart review has indicated no PEG or NG, or artificial feeding and comfort feeds have been ordered today.  She appears to be aspirating on her own secretions.  Her vocal quality is very wet and difficult to understand.  In order to speak, she is holding her trachea in an attempt to be better understood and minimize the rattle that she has in her upper airway.  She is also retaining urine, and has had a Foley catheter placed.  Patient is able to speak for herself at this point.  Her healthcare proxy would be her son Levy Pupa [phone].  She has 2 other sons, who are both involved in her care one who lives in [place], and one who lives in [place].  Patient shares that she is tired, and "ready to go be with Jesus".  She does seem to grasp that she is at end of life secondary to aspiration pneumonia.  Despite being offered comfort feeds she is not taking anything by mouth except mouth care.     SUMMARY OF RECOMMENDATIONS   Confirmed  DNR/DNI Confirmed no artificial feeding either through core track or PEG Residential hospice for end-of-life care Order placed to social work for residential hospice.  Offered choice per Medicare guidelines; family is opting for hosp[facility].  I did call hospice of the [facility] liaison with report Code Status/Advance Care Planning:  DNR    Symptom Management:   Dyspnea: Patient is short of breath at rest.  Will order IV morphine 1-2 every 3 hours as needed.  Monitor for need for scheduled dosing  Secretions: Start Robinul 0.2 mg every 6 hours as needed.  Monitor for need for scheduled dosing  Palliative Prophylaxis:   Aspiration, Bowel Regimen, Delirium Protocol, Eye Care, Frequent Pain Assessment, Oral Care and Turn Reposition  Additional Recommendations (Limitations, Scope, Preferences):  Continue to treat the treatable but patient would not want artificial feeding either through NG or permanent PEG, DNR DNI  Psycho-social/Spiritual:   Desire for further Chaplaincy support:no  Additional Recommendations: Referral to Community Resources   Prognosis:   Less than 2 weeks in the setting of severe aspiration pneumonia; patient  is aspirating on her own secretions, new A. fib with RVR, urinary retention; no longer taking anything by mouth  Discharge Planning: Residential Hospice     Primary Diagnoses: Present on Admission: . Aspiration pneumonia (HCC)   I have reviewed the medical record, interviewed the patient and family, and examined the patient. The following aspects are pertinent.  Past Medical History:  Diagnosis Date  . Glaucoma 2019  . Pregnancy 1950   G4 P4004   Social History   Socioeconomic History  . Marital status: Widowed    Spouse name: None  . Number of children: None  . Years of education: None  . Highest education level: None  Social Needs  . Financial resource strain: None  . Food insecurity - worry: None  . Food  insecurity - inability: None  . Transportation needs - medical: None  . Transportation needs - non-medical: None  Occupational History  . None  Tobacco Use  . Smoking status: Never Smoker  . Smokeless tobacco: Never Used  Substance and Sexual Activity  . Alcohol use: No    Frequency: Never  . Drug use: No  . Sexual activity: Not Currently  Other Topics Concern  . None  Social History Narrative   Lives alone on second story   Family History  Problem Relation Age of Onset  . Bowel Disease Daughter   . Stroke Son   . Other Son    Scheduled Meds: . brimonidine  1 drop Both Eyes BID   And  . timolol  1 drop Both Eyes BID  . enoxaparin (LOVENOX) injection  40 mg Subcutaneous Q24H  . latanoprost  1 drop Both Eyes QHS  . mouth rinse  15 mL Mouth Rinse BID  . metoprolol tartrate  25 mg Oral BID   Continuous Infusions: . ampicillin-sulbactam (UNASYN) IV 1.5 g (12/20/17 0935)  . dextrose 5 % and 0.45% NaCl 1,000 mL (12/20/17 0958)   PRN Meds:. Medications Prior to Admission:  Prior to Admission medications   Medication Sig Start Date End Date Taking? Authorizing Provider  COMBIGAN 0.2-0.5 % ophthalmic solution Place 1 drop into both eyes 2 (two) times daily. 11/05/17  Yes [provider]  latanoprost (XALATAN) 0.005 % ophthalmic solution Place 1 drop into both eyes at bedtime. 10/27/17  Yes [provider]   No Known Allergies Review of Systems  Unable to perform ROS: Acuity of condition    Physical Exam  Constitutional: She is oriented to person, place, and time. She appears well-developed and well-nourished.  Frail, pale elderly female; appears very weak  HENT:  Head: Atraumatic.  Cardiovascular: Normal rate.  Pulmonary/Chest:  Weak respiratory effort  Neurological: She is alert and oriented to person, place, and time.  Skin: Skin is warm and dry. There is pallor.  Psychiatric:  No overt agitation otherwise unable to test  Nursing note and vitals  reviewed.   Vital Signs: BP 122/66   Pulse (!) 45   Temp 97.9 F (36.6 C) (Oral)   Resp (!) 23   Ht 5\' 7"  (1.702 m)   Wt 64.5 kg (142 lb 3.2 oz)   SpO2 95%   BMI 22.27 kg/m  Pain Assessment: No/denies pain       SpO2: SpO2: 95 % O2 Device:SpO2: 95 % O2 Flow Rate: .O2 Flow Rate (L/min): 1 L/min  IO: Intake/output summary:   Intake/Output Summary (Last 24 hours) at 12/20/2017 1122 Last data filed at 12/20/2017 0900 Gross per 24 hour  Intake 2412.5 ml  Output 525 ml  Net 1887.5 ml    LBM: Last BM Date: (Patient does not know date of last BM ) Baseline Weight: Weight: 64.5 kg (142 lb 3.2 oz) Most recent weight: Weight: 64.5 kg (142 lb 3.2 oz)     Palliative Assessment/Data:   Flowsheet Rows     Most Recent Value  Intake Tab  Referral Department  Hospitalist  Unit at Time of Referral  Med/Surg Unit  Palliative Care Primary Diagnosis  Sepsis/Infectious Disease  Date Notified  12/18/17  Palliative Care Type  New Palliative care  Reason for referral  Clarify Goals of Care  Date of Admission  12/16/17  Date first seen by Palliative Care  12/20/17  # of days Palliative referral response time  2 Day(s)  # of days IP prior to Palliative referral  2  Clinical Assessment  Palliative Performance Scale Score  30%  Pain Max last 24 hours  Not able to report  Pain Min Last 24 hours  Not able to report  Dyspnea Max Last 24 Hours  Not able to report  Dyspnea Min Last 24 hours  Not able to report  Nausea Max Last 24 Hours  Not able to report  Nausea Min Last 24 Hours  Not able to report  Anxiety Max Last 24 Hours  Not able to report  Other Max Last 24 Hours  Not able to report  Psychosocial & Spiritual Assessment  Palliative Care Outcomes  Patient/Family meeting held?  Yes  Who was at the meeting?  pt and pt's 3 sons  Patient/Family wishes: Interventions discontinued/not started   Mechanical Ventilation, Tube feedings/TPN, PEG, Vasopressors      Time In: 1215 Time Out:  1330 Time Total: 75 min Greater than 50%  of this time was spent counseling and coordinating care related to the above assessment and plan. Staffed with Dr. Jomarie Longs  Signed by: Irean Hong, NP   Please contact Palliative Medicine Team phone at [phone] for questions and concerns.  For individual provider: See Loretha Stapler

## 2017-12-20 NOTE — Discharge Summary (Signed)
Physician Discharge Summary  Natalie Holt ZOX:[id number] DOB: 07/24/1923 DOA: 12/16/2017  PCP: Patient, No Pcp Per  Admit date: 12/16/2017 Discharge date: 12/20/2017  Time spent: 35 minutes  Recommendations for Outpatient Follow-up:  1. Residential Hospice for End of Life care   Discharge Diagnoses:  Principal Problem:   Aspiration pneumonia (HCC)   Atrial fibrillation with RVR (HCC)   Dysphagia   Adult failure to thrive   Elevated random blood glucose level   Acute kidney injury superimposed on CKD (HCC)   DNR (do not resuscitate) discussion   Bronchiectasis (HCC)   Coronary atherosclerosis due to calcified coronary lesion of native artery   Cardiomegaly   Acute respiratory failure with hypoxemia [facility](HCC)   DNR   Discharge Condition: poor  Diet recommendation: comfort feeds  Filed Weights   12/16/17 2046  Weight: 64.5 kg (142 lb 3.2 oz)    History of present illness:  84yow PMH glaucoma, not seen MD >50 yrs, presented with generalized weakness. In ED appeared well, then choked on a pill, deteriorated, developing acute hypoxic resp failure, presumed aspiration, new/sudden afib RVR. CT c/w aspiration  Hospital Course:  Aspiration pneumonia with acute hypoxic respiratory failure -afebrile, normal WBC, clinically appears to be improving -treated with IV Unasyn , weaned oxygen off -SLP eval completed, severe aspiration risk -Palliative consulted, now plan for comfort care and residential hospice  Atrial fibrillation with RVR, likely triggered by aspiration --currently in sinus rhythm --ASA. Based on GOC anticoagulation is not appropriate --pt has elected conservative management, thus echo not ordered - was on IV metoprolol, changed to Po  Dysphagia --NPO recommended per Speech therapy evaluation due to high aspiration risk, she Failed MBS --palliative consulted, s/p Family meeting today and decision made for residential hospice, comfort focused care  AKI vs  CKD --probably CKD stage III; AKI has resolved  Murmur. --suspect aortic stenosis  Cardiomegaly, CAD on CT chest -comfort care   Code Status: DNR    Consultations:  Palliative medicine  Discharge Exam: Vitals:   12/20/17 1300 12/20/17 1400  BP: (!) 107/58 (!) 124/59  Pulse: 89 87  Resp: (!) 24 (!) 24  Temp:    SpO2: 96% 96%    General: AAOx2 Cardiovascular: S1S2/RRR Respiratory: ronchi at R base  Discharge Instructions   Discharge Instructions    Discharge instructions   Complete by:  As directed    Comfort feeds   Increase activity slowly   Complete by:  As directed      Allergies as of 12/20/2017   No Known Allergies     Medication List    TAKE these medications   amoxicillin-clavulanate 875-125 MG tablet Commonly known as:  AUGMENTIN Take 1 tablet by mouth 2 (two) times daily. For 3days   COMBIGAN 0.2-0.5 % ophthalmic solution Generic drug:  brimonidine-timolol Place 1 drop into both eyes 2 (two) times daily.   latanoprost 0.005 % ophthalmic solution Commonly known as:  XALATAN Place 1 drop into both eyes at bedtime.   metoprolol tartrate 25 MG tablet Commonly known as:  LOPRESSOR Take 1 tablet (25 mg total) by mouth 2 (two) times daily.      No Known Allergies Follow-up Information    Annita BrodAsenso, Philip, MD Follow up.   Specialty:  Internal Medicine Why:  PCP establishment Contact information: [address] [place] [address] [phone]            The results of significant diagnostics from this hospitalization (including imaging, microbiology, ancillary and laboratory)  are listed below for reference.    Significant Diagnostic Studies: Dg Chest 2 View  Result Date: 12/16/2017 CLINICAL DATA:  Productive cough for 2 days EXAM: CHEST  2 VIEW COMPARISON:  None. FINDINGS: Cardiac shadow is at the upper limits of normal in size. Aortic calcifications are seen. The lungs are mildly hyperinflated. No focal infiltrate or  sizable effusion is seen. No bony abnormality is noted. IMPRESSION: No active cardiopulmonary disease. Electronically Signed   By: Alcide Clever M.D.   On: 12/16/2017 13:22   Ct Head Wo Contrast  Result Date: 12/16/2017 CLINICAL DATA:  84 year old female found slumped over toilet. Appears weak. Initial encounter. EXAM: CT HEAD WITHOUT CONTRAST TECHNIQUE: Contiguous axial images were obtained from the base of the skull through the vertex without intravenous contrast. COMPARISON:  None. FINDINGS: Brain: No intracranial hemorrhage or CT evidence of large acute infarct. Mild chronic microvascular changes. Moderate global atrophy. No intracranial mass lesion noted on this unenhanced exam. Vascular: Vascular calcifications. Skull: Negative Sinuses/Orbits: No acute orbital abnormality right. Minimal right maxillary sinus and ethmoid sinus air cells bilaterally. Other: Mastoid air cells and middle ear cavities are clear. IMPRESSION: No acute intracranial abnormality. Mild chronic microvascular changes. Moderate global atrophy. Electronically Signed   By: Lacy Duverney M.D.   On: 12/16/2017 13:48   Ct Chest Wo Contrast  Result Date: 12/16/2017 CLINICAL DATA:  Increased weakness. Patient aspirated with tachycardia. EXAM: CT CHEST WITHOUT CONTRAST TECHNIQUE: Multidetector CT imaging of the chest was performed following the standard protocol without IV contrast. COMPARISON:  12/16/2017 CXR FINDINGS: Cardiovascular: Atherosclerosis of the great vessels with normal branch pattern. Mild to moderate aortic atherosclerosis without aneurysm. Heart is mildly enlarged without pericardial effusion. There is left main and three-vessel coronary arteriosclerosis. Pulmonary vasculature is unremarkable for this unenhanced study. Mediastinum/Nodes: Normal thyroid gland without focal mass. Patent trachea and mainstem bronchi. Unremarkable CT appearance of the esophagus. No mediastinal or hilar lymphadenopathy given limitations of a  noncontrast study. Lungs/Pleura: Patchy pneumonic consolidation and atelectasis is seen in the left lower lobe with faint ground-glass opacities in the left upper lobe and lingula. Minimal atelectasis is seen in the right lower lobe. There is mild bilateral bronchiectasis and bronchiolectasis. Upper Abdomen: Slight surface nodularity of the liver compatible with morphologic changes of cirrhosis. No space-occupying mass nor biliary dilatation. No acute abnormality in the upper abdomen. Musculoskeletal: No chest wall mass or suspicious bone lesions identified. IMPRESSION: 1. Left lower lobe pulmonary consolidations compatible with aspiration pneumonia given reported history. Lesser degree of faint ground-glass opacities are noted in the left upper lobe and lingula. 2. There is bilateral bronchiectasis and bronchiolectasis. 3. Cardiomegaly with coronary arteriosclerosis. 4. Aortic atherosclerosis. Aortic Atherosclerosis (ICD10-I70.0). Electronically Signed   By: Tollie Eth M.D.   On: 12/16/2017 19:58   Dg Chest Portable 1 View  Result Date: 12/16/2017 CLINICAL DATA:  Generalized weakness. EXAM: PORTABLE CHEST 1 VIEW COMPARISON:  12/16/2017 FINDINGS: Patient rotated left. Mild cardiomegaly with transverse aortic atherosclerosis. No right and no definite left pleural effusion. No pneumothorax. Biapical pleural thickening. medial right lung base and possible developing left base airspace disease. IMPRESSION: Hyperinflation with development of right and possible left base airspace disease. Most likely atelectasis on this AP portable radiograph. Early pneumonia or aspiration could look similar. Cardiomegaly.  Aortic Atherosclerosis (ICD10-I70.0). Electronically Signed   By: Jeronimo Greaves M.D.   On: 12/16/2017 17:10   Dg Swallowing Func-speech Pathology  Result Date: 12/17/2017 Objective Swallowing Evaluation: Type of Study: MBS-Modified Barium Swallow Study  Patient Details Name: Natalie Holt MRN: [id number] Date of  Birth: 13-Oct-1923 Today's Date: 12/17/2017 Time: SLP Start Time (ACUTE ONLY): 1443 -SLP Stop Time (ACUTE ONLY): 1455 SLP Time Calculation (min) (ACUTE ONLY): 12 min Past Medical History: Past Medical History: Diagnosis Date . Glaucoma 2019 . Pregnancy 1948  G4 P4004 Past Surgical History: Past Surgical History: Procedure Laterality Date . TONSILLECTOMY    as a child HPI: LALANA WACHTER is a 84 y.o. female with medical history of glaucoma who has not seen a primary physician in over 50 years. During her time in the emergency department the patient was offered something to drink and a pill and she seemed to be choking on it. She appeared to be having difficulty managing her secretions and there was concern she may have aspirated prior to admission. Chest CT (3/6) revealed left lower lobe pulmonary consolidations compatible with aspiration pneumonia.  Subjective: Pt alert and cooperative to assessment Assessment / Plan / Recommendation CHL IP CLINICAL IMPRESSIONS 12/17/2017 Clinical Impression Pt presents with severe oropharyngeal dysphagia characterized by reduced laryngeal elevation/anterior mobility, base of tongue retraction, reduced epiglottic inversion, and weak lingual manipulation. Pt's epiglottis was noted to be smaller in size, leading to a smaller vallecular space and reduced deflection with thinner consistencies. Pt's smaller vallecula coupled with her reduced lingual propulsion led premature spillage to deflect off of full vallecula into laryngeal vestibule before and after the swallow. Pt used chin tuck strategy with Mod verbal/visual sues for thin liquid to contain premature spillage; however, strategy did not eliminate airway compromise. SLP provided max verbal cues to clear throat/cough to produce cough; however, her weak cough was minimally able to reduce penetrates. Given airway compromise with all consistencies tested, limited sensation, and reduced ability to protect airway, recommend NPO. Alternative  means of nutrition potentially needed, depending on pt's GOC.  SLP Visit Diagnosis Dysphagia, oropharyngeal phase (R13.12) Attention and concentration deficit following -- Frontal lobe and executive function deficit following -- Impact on safety and function Severe aspiration risk   CHL IP TREATMENT RECOMMENDATION 12/17/2017 Treatment Recommendations Therapy as outlined in treatment plan below   Prognosis 12/17/2017 Prognosis for Safe Diet Advancement Fair Barriers to Reach Goals Severity of deficits Barriers/Prognosis Comment -- CHL IP DIET RECOMMENDATION 12/17/2017 SLP Diet Recommendations NPO;Alternative means - temporary Liquid Administration via -- Medication Administration Via alternative means Compensations -- Postural Changes --   CHL IP OTHER RECOMMENDATIONS 12/17/2017 Recommended Consults -- Oral Care Recommendations Oral care QID Other Recommendations Have oral suction available   CHL IP FOLLOW UP RECOMMENDATIONS 12/17/2017 Follow up Recommendations Other (comment)   CHL IP FREQUENCY AND DURATION 12/17/2017 Speech Therapy Frequency (ACUTE ONLY) min 2x/week Treatment Duration 2 weeks      CHL IP ORAL PHASE 12/17/2017 Oral Phase Impaired Oral - Pudding Teaspoon -- Oral - Pudding Cup -- Oral - Honey Teaspoon Premature spillage;Weak lingual manipulation Oral - Honey Cup -- Oral - Nectar Teaspoon Premature spillage;Weak lingual manipulation Oral - Nectar Cup -- Oral - Nectar Straw -- Oral - Thin Teaspoon Premature spillage;Weak lingual manipulation Oral - Thin Cup Premature spillage;Weak lingual manipulation Oral - Thin Straw -- Oral - Puree Weak lingual manipulation;Premature spillage Oral - Mech Soft -- Oral - Regular -- Oral - Multi-Consistency -- Oral - Pill -- Oral Phase - Comment --  CHL IP PHARYNGEAL PHASE 12/17/2017 Pharyngeal Phase Impaired Pharyngeal- Pudding Teaspoon -- Pharyngeal -- Pharyngeal- Pudding Cup -- Pharyngeal -- Pharyngeal- Honey Teaspoon Penetration/Aspiration before swallow;Pharyngeal residue -  valleculae;Reduced anterior laryngeal mobility;Reduced  laryngeal elevation;Reduced tongue base retraction;Compensatory strategies attempted (with notebox) Pharyngeal Material enters airway, passes BELOW cords without attempt by patient to eject out (silent aspiration) Pharyngeal- Honey Cup -- Pharyngeal -- Pharyngeal- Nectar Teaspoon Penetration/Aspiration before swallow;Pharyngeal residue - valleculae;Reduced laryngeal elevation;Reduced anterior laryngeal mobility;Reduced tongue base retraction Pharyngeal Material enters airway, passes BELOW cords without attempt by patient to eject out (silent aspiration) Pharyngeal- Nectar Cup -- Pharyngeal -- Pharyngeal- Nectar Straw -- Pharyngeal -- Pharyngeal- Thin Teaspoon Penetration/Apiration after swallow;Compensatory strategies attempted (with notebox);Reduced tongue base retraction;Reduced laryngeal elevation;Pharyngeal residue - valleculae;Reduced anterior laryngeal mobility;Reduced epiglottic inversion Pharyngeal Material enters airway, passes BELOW cords without attempt by patient to eject out (silent aspiration) Pharyngeal- Thin Cup Penetration/Aspiration before swallow;Reduced laryngeal elevation;Reduced anterior laryngeal mobility;Reduced tongue base retraction;Pharyngeal residue - valleculae Pharyngeal Material enters airway, passes BELOW cords and not ejected out despite cough attempt by patient Pharyngeal- Thin Straw -- Pharyngeal -- Pharyngeal- Puree Penetration/Aspiration before swallow;Reduced tongue base retraction;Reduced laryngeal elevation;Reduced anterior laryngeal mobility;Pharyngeal residue - valleculae Pharyngeal Material enters airway, passes BELOW cords without attempt by patient to eject out (silent aspiration) Pharyngeal- Mechanical Soft -- Pharyngeal -- Pharyngeal- Regular -- Pharyngeal -- Pharyngeal- Multi-consistency -- Pharyngeal -- Pharyngeal- Pill -- Pharyngeal -- Pharyngeal Comment --  CHL IP CERVICAL ESOPHAGEAL PHASE 12/17/2017 Cervical  Esophageal Phase WFL Pudding Teaspoon -- Pudding Cup -- Honey Teaspoon -- Honey Cup -- Nectar Teaspoon -- Nectar Cup -- Nectar Straw -- Thin Teaspoon -- Thin Cup -- Thin Straw -- Puree -- Mechanical Soft -- Regular -- Multi-consistency -- Pill -- Cervical Esophageal Comment -- No flowsheet data found. Natalie Holt 12/17/2017, 5:31 PM  Natalie Holt, M.A. CCC-SLP [phone]              Microbiology: Recent Results (from the past 240 hour(s))  Culture, blood (routine x 2) Call MD if unable to obtain prior to antibiotics being given     Status: None (Preliminary result)   Collection Time: 12/16/17  8:59 PM  Result Value Ref Range Status   Specimen Description BLOOD RIGHT ANTECUBITAL  Final   Special Requests   Final    BOTTLES DRAWN AEROBIC ONLY Blood Culture adequate volume   Culture   Final    NO GROWTH 4 DAYS Performed at [facility], [address] [place], [place] [address]    Report Status PENDING  Incomplete  Culture, blood (routine x 2) Call MD if unable to obtain prior to antibiotics being given     Status: None (Preliminary result)   Collection Time: 12/16/17  9:05 PM  Result Value Ref Range Status   Specimen Description BLOOD RIGHT HAND  Final   Special Requests IN PEDIATRIC BOTTLE Blood Culture adequate volume  Final   Culture   Final    NO GROWTH 4 DAYS Performed at [facility], [address], [place], [place] [address]    Report Status PENDING  Incomplete  MRSA PCR Screening     Status: None   Collection Time: 12/19/17 11:06 PM  Result Value Ref Range Status   MRSA by PCR NEGATIVE NEGATIVE Final    Comment:        The GeneXpert MRSA Assay (FDA approved for NASAL specimens only), is one component of a comprehensive MRSA colonization surveillance program. It is not intended to diagnose MRSA infection nor to guide or monitor treatment for MRSA infections. Performed at [facility], [address] [place], [place] [address]       Labs: Basic Metabolic Panel:  Recent Labs  Lab 12/16/17 1538 12/16/17 2100 12/17/17 0249 12/18/17 0451 12/19/17 0236  NA 138  --  139 143 140  K 3.8  --  3.8 3.9 3.1*  CL 103  --  109 111 111  CO2 21*  --  18* 22 19*  GLUCOSE 136*  --  145* 95 153*  BUN 27*  --  24* 23* 17  CREATININE 1.50* 1.37* 1.16* 1.16* 0.95  CALCIUM 9.5  --  8.4* 8.9 7.9*   Liver Function Tests: Recent Labs  Lab 12/16/17 1538  AST 85*  ALT 32  ALKPHOS 93  BILITOT 1.1  PROT 7.7  ALBUMIN 3.9   No results for input(s): LIPASE, AMYLASE in the last 168 hours. No results for input(s): AMMONIA in the last 168 hours. CBC: Recent Labs  Lab 12/16/17 1538 12/16/17 2100 12/17/17 0249 12/18/17 0451 12/19/17 0236  WBC 12.0* 5.3 6.0 6.9 7.1  HGB 13.3 12.9 11.3* 11.3* 10.4*  HCT 38.4 37.0 32.8* 34.0* 31.3*  MCV 89.5 89.8 88.9 92.1 91.8  PLT 220 214 196 201 192   Cardiac Enzymes: No results for input(s): CKTOTAL, CKMB, CKMBINDEX, TROPONINI in the last 168 hours. BNP: BNP (last 3 results) No results for input(s): BNP in the last 8760 hours.  ProBNP (last 3 results) No results for input(s): PROBNP in the last 8760 hours.  CBG: Recent Labs  Lab 12/19/17 2119  GLUCAP 117*       Signed:  Zannie Cove MD.  [facility] 12/20/2017, 3:16 PM

## 2017-12-20 NOTE — Progress Notes (Signed)
PROGRESS NOTE  Natalie Holt ZOX:[id number] DOB: May 19, 1923 DOA: 12/16/2017 PCP: Patient, No Pcp Per  Brief Narrative: 89yow PMH glaucoma, not seen MD >50 yrs, presented with generalized weakness. In ED appeared well, then choked on a pill, deteriorated, developing acute hypoxic resp failure, presumed aspiration, new/sudden afib RVR. CT c/w aspiration. Admitted for aspiration pneumonia with acute hypoxic resp failure, afib RVR, dysphagia, AKI.  Assessment/Plan  Aspiration pneumonia with acute hypoxic respiratory failure -afebrile, normal WBC, clinically appears to be improving -continue IV Unasyn Day 4 of ABX, oxygen off -SLP eval completed, severe aspiration risk -Palliative consulted  Atrial fibrillation with RVR, likely triggered by aspiration --currently in SR. Strategy will be rate-control. --ASA. Based on GOC anticoagulation is not appropriate --pt has elected conservative management, thus echo not ordered - on IV metoprolol, change to Po  Dysphagia --NPO per ST. Failed MBS --palliative consult pending - per discussion with pt and son she wouldn't want PEG or NG tube, I talked briefly to son about this he was agreeable to comfort feeds, yesterday pt was reluctant to try this but this morning agrees to try some food -ordered Dysphagia 3 diet   AKI vs CKD --probably CKD stage III; AKI has resolved  Murmur. --suspect aortic stenosis  Cardiomegaly, CAD on CT chest --pt has elected conservative management  ETHICS: Pt desires conservative management, DNR, Continue abx, Will consult PMT to assist with GOC as nutrition appears to be an issue.   DVT prophylaxis: enoxaparin Code Status: DNR Family Communication: no family at bedside, called and d/w son 3/8 Disposition Plan: to be determined  Zannie Cove, MD  [facility] Page via [email or web address]; password [email or web address]  7PM-7AM contact night coverage as above 12/20/2017, 11:01 AM  LOS: 4 days    Consultants:  PMT  Procedures:    Antimicrobials:  Unasyn 3/7 >>  Interval history/Subjective: RN reported hypotension, rapid HR up to 130s. Pt asymptomatic.  Currently in SR. Patient awake, alert, denies pain, SOB.  Objective: Vitals:  Vitals:   12/20/17 0900 12/20/17 1000  BP: (!) 158/70 122/66  Pulse: 84 (!) 45  Resp: 17 (!) 23  Temp:    SpO2: 96% 95%    Exam: Gen: Awake, Alert, Oriented X 2 HEENT: PERRLA, Neck supple, no JVD Lungs: ronchi at R base CVS: S1S2/RRR AS murmur Abd: soft, Non tender, non distended, BS present Extremities: No Cyanosis, Clubbing or edema Skin: no new rashes  I have personally reviewed the following:   Labs:   Imaging studies:  CT chest noted LLL aspiration pneumonia, bilateral bronchiectasis   Scheduled Meds: . brimonidine  1 drop Both Eyes BID   And  . timolol  1 drop Both Eyes BID  . enoxaparin (LOVENOX) injection  40 mg Subcutaneous Q24H  . latanoprost  1 drop Both Eyes QHS  . mouth rinse  15 mL Mouth Rinse BID  . metoprolol tartrate  5 mg Intravenous Q6H   Continuous Infusions: . ampicillin-sulbactam (UNASYN) IV 1.5 g (12/20/17 0935)  . dextrose 5 % and 0.45% NaCl 1,000 mL (12/20/17 0958)    Principal Problem:   Aspiration pneumonia (HCC) Active Problems:   Atrial fibrillation with RVR (HCC)   Dysphagia   Elevated random blood glucose level   Acute kidney injury superimposed on CKD (HCC)   DNR (do not resuscitate) discussion   Bronchiectasis (HCC)   Coronary atherosclerosis due to calcified coronary lesion of native artery   Cardiomegaly   Acute respiratory failure with hypoxemia (HCC)   Pressure injury  of skin   LOS: 4 days

## 2017-12-20 NOTE — Progress Notes (Signed)
CSW received consult regarding [facility]'s [facility]. They are assessing patient.  Osborne Cascoadia Jahmar Mckelvy LCSW [phone]

## 2017-12-20 NOTE — Progress Notes (Signed)
Patient will DC to: [facility] Anticipated DC date: 12/20/17 Family notified: Son Transport by: Sharin MonsPTAR   Per MD patient ready for DC to Hospice. RN, patient, patient's family, and facility notified of DC. Discharge Summary sent to facility. RN given number for report [phone]. DC packet on chart. Ambulance transport requested for patient.   CSW signing off.  Cristobal GoldmannNadia Namari Breton, LCSW Clinical Social Worker [phone]

## 2017-12-20 NOTE — Progress Notes (Signed)
[facility]:  Met with family at bedside. 3 children and there spouses. PT is alert and oriented. She is hard to understand due to congestions and constantly coughing throughout the visit. Suctioning her secretions X 2 during my visit. She and the family are receptive to the hospice philosophy and after getting approval from our Dr. At [facility] home bed was offered and family accepted. Percell Locus MSW was notified and pt will be transferred today to the [facility] in [facility] for comfort care. Webb Silversmith RN [phone]

## 2017-12-21 LAB — CULTURE, BLOOD (ROUTINE X 2)
CULTURE: NO GROWTH
CULTURE: NO GROWTH
SPECIAL REQUESTS: ADEQUATE
Special Requests: ADEQUATE

## 2018-01-01 ENCOUNTER — Non-Acute Institutional Stay (SKILLED_NURSING_FACILITY): Payer: Medicare Other | Admitting: Internal Medicine

## 2018-01-01 ENCOUNTER — Encounter: Payer: Self-pay | Admitting: Internal Medicine

## 2018-01-01 DIAGNOSIS — R1013 Epigastric pain: Secondary | ICD-10-CM

## 2018-01-01 DIAGNOSIS — J690 Pneumonitis due to inhalation of food and vomit: Secondary | ICD-10-CM

## 2018-01-01 DIAGNOSIS — H409 Unspecified glaucoma: Secondary | ICD-10-CM | POA: Diagnosis not present

## 2018-01-01 DIAGNOSIS — I517 Cardiomegaly: Secondary | ICD-10-CM

## 2018-01-01 DIAGNOSIS — I4891 Unspecified atrial fibrillation: Secondary | ICD-10-CM

## 2018-01-01 DIAGNOSIS — R1310 Dysphagia, unspecified: Secondary | ICD-10-CM

## 2018-01-01 DIAGNOSIS — Z515 Encounter for palliative care: Secondary | ICD-10-CM | POA: Diagnosis not present

## 2018-01-01 NOTE — Progress Notes (Signed)
: Provider:  Randon Holt. Natalie Hollingshead, MD Location:  [facility] Room Number: [phone] Place of Service:  SNF (31)  PCP: Patient, No Pcp Per Patient Care Team: Patient, No Pcp Per as PCP - General (General Practice)  Extended Emergency Contact Information Primary Emergency Contact: Natalie Holt Mobile Phone: [phone] Relation: Son     Allergies: Patient has no known allergies.  Chief Complaint  Patient presents with  . New Admit To SNF    HPI: Patient is 84 y.o. female with afib, dysphasia and glaucoma who was admitted to skilled nursing facility on 12/31/17 for OT/PT.  Patient is a hospice patient who did not diet become place and so his transfer to [facility].  Patient has no complaint except that she is ready to die and would like to die.  Past Medical History:  Diagnosis Date  . A-fib (HCC) 12/16/2017  . Aspiration pneumonia (HCC) 12/16/2017  . Dysphagia   . Glaucoma 2019  . Pregnancy 1950   G4 P4004    Past Surgical History:  Procedure Laterality Date  . TONSILLECTOMY     as a child    Allergies as of 01/01/2018   No Known Allergies     Medication List        Accurate as of 01/01/18  9:31 AM. Always use your most recent med list.          COMBIGAN 0.2-0.5 % ophthalmic solution Generic drug:  brimonidine-timolol Place 1 drop into both eyes 2 (two) times daily.   latanoprost 0.005 % ophthalmic solution Commonly known as:  XALATAN Place 1 drop into both eyes at bedtime.   metoprolol tartrate 25 MG tablet Commonly known as:  LOPRESSOR Take 1 tablet (25 mg total) by mouth 2 (two) times daily.       No orders of the defined types were placed in this encounter.    There is no immunization history on file for this patient.  Social History   Tobacco Use  . Smoking status: Never Smoker  . Smokeless tobacco: Never Used  Substance Use Topics  . Alcohol use: No    Frequency: Never    Family history is   Family History    Problem Relation Age of Onset  . Bowel Disease Daughter   . Stroke Son   . Other Son       Review of Systems  DATA OBTAINED: from patient GENERAL:  no fevers, fatigue, appetite changes SKIN: No itching, or rash EYES: No eye pain, redness, discharge EARS: No earache, tinnitus, change in hearing NOSE: No congestion, drainage or bleeding  MOUTH/THROAT: No mouth or tooth pain, No sore throat RESPIRATORY: No cough, wheezing, SOB CARDIAC: No chest pain, palpitations, lower extremity edema  GI: No abdominal pain, No N/V/D or constipation, No heartburn or reflux  GU: No dysuria, frequency or urgency, or incontinence  MUSCULOSKELETAL: No unrelieved bone/joint pain NEUROLOGIC: No headache, dizziness or focal weakness PSYCHIATRIC: No c/o anxiety or sadness except the patient is ready to die  Vitals:   01/01/18 0914  BP: 128/65  Pulse: 83  Resp: 20  Temp: 98.5 F (36.9 C)  SpO2: 94%    SpO2 Readings from Last 1 Encounters:  01/01/18 94%   Body mass index is 22.27 kg/m.     Physical Exam  GENERAL APPEARANCE: Delightful white female alert, conversant,  No acute distress.  SKIN: No diaphoresis rash HEAD: Normocephalic, atraumatic  EYES: Conjunctiva/lids clear. Pupils round, reactive. EOMs intact.  EARS: External exam WNL, canals clear. Hearing grossly normal.  NOSE: No deformity or discharge.  MOUTH/THROAT: Lips w/o lesions  RESPIRATORY: Breathing is even, unlabored. Lung sounds are clear   CARDIOVASCULAR: Heart RRR no murmurs, rubs or gallops. No peripheral edema.   GASTROINTESTINAL: Abdomen is soft, non-tender, not distended w/ normal bowel sounds. GENITOURINARY: Bladder non tender, not distended  MUSCULOSKELETAL: No abnormal joints or musculature NEUROLOGIC:  Cranial nerves 2-12 grossly intact. Moves all extremities  PSYCHIATRIC: Mood and affect appropriate to situation, no behavioral issues  Patient Active Problem List   Diagnosis Date Noted  . Palliative care by  specialist   . Pressure injury of skin 12/18/2017  . Aspiration pneumonia (HCC) 12/16/2017  . Atrial fibrillation with RVR (HCC) 12/16/2017  . Dysphagia 12/16/2017  . Elevated random blood glucose level 12/16/2017  . Acute kidney injury superimposed on CKD (HCC) 12/16/2017  . Goals of care, counseling/discussion 12/16/2017  . Bronchiectasis (HCC) 12/16/2017  . Coronary atherosclerosis due to calcified coronary lesion of native artery 12/16/2017  . Cardiomegaly 12/16/2017  . Acute respiratory failure with hypoxemia (HCC) 12/16/2017      Labs reviewed: Basic Metabolic Panel:    Component Value Date/Time   NA 140 12/19/2017 0236   K 3.1 (L) 12/19/2017 0236   CL 111 12/19/2017 0236   CO2 19 (L) 12/19/2017 0236   GLUCOSE 153 (H) 12/19/2017 0236   BUN 17 12/19/2017 0236   CREATININE 0.95 12/19/2017 0236   CALCIUM 7.9 (L) 12/19/2017 0236   PROT 7.7 12/16/2017 1538   ALBUMIN 3.9 12/16/2017 1538   AST 85 (H) 12/16/2017 1538   ALT 32 12/16/2017 1538   ALKPHOS 93 12/16/2017 1538   BILITOT 1.1 12/16/2017 1538   GFRNONAA 50 (L) 12/19/2017 0236   GFRAA 58 (L) 12/19/2017 0236    Recent Labs    12/17/17 0249 12/18/17 0451 12/19/17 0236  NA 139 143 140  K 3.8 3.9 3.1*  CL 109 111 111  CO2 18* 22 19*  GLUCOSE 145* 95 153*  BUN 24* 23* 17  CREATININE 1.16* 1.16* 0.95  CALCIUM 8.4* 8.9 7.9*   Liver Function Tests: Recent Labs    12/16/17 1538  AST 85*  ALT 32  ALKPHOS 93  BILITOT 1.1  PROT 7.7  ALBUMIN 3.9   No results for input(s): LIPASE, AMYLASE in the last 8760 hours. No results for input(s): AMMONIA in the last 8760 hours. CBC: Recent Labs    12/17/17 0249 12/18/17 0451 12/19/17 0236  WBC 6.0 6.9 7.1  HGB 11.3* 11.3* 10.4*  HCT 32.8* 34.0* 31.3*  MCV 88.9 92.1 91.8  PLT 196 201 192   Lipid No results for input(s): CHOL, HDL, LDLCALC, TRIG in the last 8760 hours.  Cardiac Enzymes: No results for input(s): CKTOTAL, CKMB, CKMBINDEX, TROPONINI in the last  8760 hours. BNP: No results for input(s): BNP in the last 8760 hours. No results found for: MICROALBUR No results found for: HGBA1C No results found for: TSH No results found for: VITAMINB12 No results found for: FOLATE No results found for: IRON, TIBC, FERRITIN  Imaging and Procedures obtained prior to SNF admission: Dg Chest 2 View  Result Date: 12/16/2017 CLINICAL DATA:  Productive cough for 2 days EXAM: CHEST  2 VIEW COMPARISON:  None. FINDINGS: Cardiac shadow is at the upper limits of normal in size. Aortic calcifications are seen. The lungs are mildly hyperinflated. No focal infiltrate or sizable effusion is seen. No bony abnormality is noted. IMPRESSION: No active cardiopulmonary disease.  Electronically Signed   By: Alcide CleverMark  Lukens M.D.   On: 12/16/2017 13:22   Ct Head Wo Contrast  Result Date: 12/16/2017 CLINICAL DATA:  84 year old female found slumped over toilet. Appears weak. Initial encounter. EXAM: CT HEAD WITHOUT CONTRAST TECHNIQUE: Contiguous axial images were obtained from the base of the skull through the vertex without intravenous contrast. COMPARISON:  None. FINDINGS: Brain: No intracranial hemorrhage or CT evidence of large acute infarct. Mild chronic microvascular changes. Moderate global atrophy. No intracranial mass lesion noted on this unenhanced exam. Vascular: Vascular calcifications. Skull: Negative Sinuses/Orbits: No acute orbital abnormality right. Minimal right maxillary sinus and ethmoid sinus air cells bilaterally. Other: Mastoid air cells and middle ear cavities are clear. IMPRESSION: No acute intracranial abnormality. Mild chronic microvascular changes. Moderate global atrophy. Electronically Signed   By: Lacy DuverneySteven  Olson M.D.   On: 12/16/2017 13:48   Ct Chest Wo Contrast  Result Date: 12/16/2017 CLINICAL DATA:  Increased weakness. Patient aspirated with tachycardia. EXAM: CT CHEST WITHOUT CONTRAST TECHNIQUE: Multidetector CT imaging of the chest was performed following  the standard protocol without IV contrast. COMPARISON:  12/16/2017 CXR FINDINGS: Cardiovascular: Atherosclerosis of the great vessels with normal branch pattern. Mild to moderate aortic atherosclerosis without aneurysm. Heart is mildly enlarged without pericardial effusion. There is left main and three-vessel coronary arteriosclerosis. Pulmonary vasculature is unremarkable for this unenhanced study. Mediastinum/Nodes: Normal thyroid gland without focal mass. Patent trachea and mainstem bronchi. Unremarkable CT appearance of the esophagus. No mediastinal or hilar lymphadenopathy given limitations of a noncontrast study. Lungs/Pleura: Patchy pneumonic consolidation and atelectasis is seen in the left lower lobe with faint ground-glass opacities in the left upper lobe and lingula. Minimal atelectasis is seen in the right lower lobe. There is mild bilateral bronchiectasis and bronchiolectasis. Upper Abdomen: Slight surface nodularity of the liver compatible with morphologic changes of cirrhosis. No space-occupying mass nor biliary dilatation. No acute abnormality in the upper abdomen. Musculoskeletal: No chest wall mass or suspicious bone lesions identified. IMPRESSION: 1. Left lower lobe pulmonary consolidations compatible with aspiration pneumonia given reported history. Lesser degree of faint ground-glass opacities are noted in the left upper lobe and lingula. 2. There is bilateral bronchiectasis and bronchiolectasis. 3. Cardiomegaly with coronary arteriosclerosis. 4. Aortic atherosclerosis. Aortic Atherosclerosis (ICD10-I70.0). Electronically Signed   By: Tollie Ethavid  Kwon M.D.   On: 12/16/2017 19:58   Dg Chest Portable 1 View  Result Date: 12/16/2017 CLINICAL DATA:  Generalized weakness. EXAM: PORTABLE CHEST 1 VIEW COMPARISON:  12/16/2017 FINDINGS: Patient rotated left. Mild cardiomegaly with transverse aortic atherosclerosis. No right and no definite left pleural effusion. No pneumothorax. Biapical pleural  thickening. medial right lung base and possible developing left base airspace disease. IMPRESSION: Hyperinflation with development of right and possible left base airspace disease. Most likely atelectasis on this AP portable radiograph. Early pneumonia or aspiration could look similar. Cardiomegaly.  Aortic Atherosclerosis (ICD10-I70.0). Electronically Signed   By: Jeronimo GreavesKyle  Talbot M.D.   On: 12/16/2017 17:10   Dg Swallowing Func-speech Pathology  Result Date: 12/17/2017 Objective Swallowing Evaluation: Type of Study: MBS-Modified Barium Swallow Study  Patient Details Name: Natalie Holt MRN: [id number] Date of Birth: 12/10/1922 Today's Date: 12/17/2017 Time: SLP Start Time (ACUTE ONLY): 1443 -SLP Stop Time (ACUTE ONLY): 1455 SLP Time Calculation (min) (ACUTE ONLY): 12 min Past Medical History: Past Medical History: Diagnosis Date . Glaucoma 2019 . Pregnancy 1948  G4 P4004 Past Surgical History: Past Surgical History: Procedure Laterality Date . TONSILLECTOMY    as a child HPI: Reia P  Poma is a 84 y.o. female with medical history of glaucoma who has not seen a primary physician in over 50 years. During her time in the emergency department the patient was offered something to drink and a pill and she seemed to be choking on it. She appeared to be having difficulty managing her secretions and there was concern she may have aspirated prior to admission. Chest CT (3/6) revealed left lower lobe pulmonary consolidations compatible with aspiration pneumonia.  Subjective: Pt alert and cooperative to assessment Assessment / Plan / Recommendation CHL IP CLINICAL IMPRESSIONS 12/17/2017 Clinical Impression Pt presents with severe oropharyngeal dysphagia characterized by reduced laryngeal elevation/anterior mobility, base of tongue retraction, reduced epiglottic inversion, and weak lingual manipulation. Pt's epiglottis was noted to be smaller in size, leading to a smaller vallecular space and reduced deflection with thinner  consistencies. Pt's smaller vallecula coupled with her reduced lingual propulsion led premature spillage to deflect off of full vallecula into laryngeal vestibule before and after the swallow. Pt used chin tuck strategy with Mod verbal/visual sues for thin liquid to contain premature spillage; however, strategy did not eliminate airway compromise. SLP provided max verbal cues to clear throat/cough to produce cough; however, her weak cough was minimally able to reduce penetrates. Given airway compromise with all consistencies tested, limited sensation, and reduced ability to protect airway, recommend NPO. Alternative means of nutrition potentially needed, depending on pt's GOC.  SLP Visit Diagnosis Dysphagia, oropharyngeal phase (R13.12) Attention and concentration deficit following -- Frontal lobe and executive function deficit following -- Impact on safety and function Severe aspiration risk   CHL IP TREATMENT RECOMMENDATION 12/17/2017 Treatment Recommendations Therapy as outlined in treatment plan below   Prognosis 12/17/2017 Prognosis for Safe Diet Advancement Fair Barriers to Reach Goals Severity of deficits Barriers/Prognosis Comment -- CHL IP DIET RECOMMENDATION 12/17/2017 SLP Diet Recommendations NPO;Alternative means - temporary Liquid Administration via -- Medication Administration Via alternative means Compensations -- Postural Changes --   CHL IP OTHER RECOMMENDATIONS 12/17/2017 Recommended Consults -- Oral Care Recommendations Oral care QID Other Recommendations Have oral suction available   CHL IP FOLLOW UP RECOMMENDATIONS 12/17/2017 Follow up Recommendations Other (comment)   CHL IP FREQUENCY AND DURATION 12/17/2017 Speech Therapy Frequency (ACUTE ONLY) min 2x/week Treatment Duration 2 weeks      CHL IP ORAL PHASE 12/17/2017 Oral Phase Impaired Oral - Pudding Teaspoon -- Oral - Pudding Cup -- Oral - Honey Teaspoon Premature spillage;Weak lingual manipulation Oral - Honey Cup -- Oral - Nectar Teaspoon Premature  spillage;Weak lingual manipulation Oral - Nectar Cup -- Oral - Nectar Straw -- Oral - Thin Teaspoon Premature spillage;Weak lingual manipulation Oral - Thin Cup Premature spillage;Weak lingual manipulation Oral - Thin Straw -- Oral - Puree Weak lingual manipulation;Premature spillage Oral - Mech Soft -- Oral - Regular -- Oral - Multi-Consistency -- Oral - Pill -- Oral Phase - Comment --  CHL IP PHARYNGEAL PHASE 12/17/2017 Pharyngeal Phase Impaired Pharyngeal- Pudding Teaspoon -- Pharyngeal -- Pharyngeal- Pudding Cup -- Pharyngeal -- Pharyngeal- Honey Teaspoon Penetration/Aspiration before swallow;Pharyngeal residue - valleculae;Reduced anterior laryngeal mobility;Reduced laryngeal elevation;Reduced tongue base retraction;Compensatory strategies attempted (with notebox) Pharyngeal Material enters airway, passes BELOW cords without attempt by patient to eject out (silent aspiration) Pharyngeal- Honey Cup -- Pharyngeal -- Pharyngeal- Nectar Teaspoon Penetration/Aspiration before swallow;Pharyngeal residue - valleculae;Reduced laryngeal elevation;Reduced anterior laryngeal mobility;Reduced tongue base retraction Pharyngeal Material enters airway, passes BELOW cords without attempt by patient to eject out (silent aspiration) Pharyngeal- Nectar Cup -- Pharyngeal -- Pharyngeal- Nectar Straw --  Pharyngeal -- Pharyngeal- Thin Teaspoon Penetration/Apiration after swallow;Compensatory strategies attempted (with notebox);Reduced tongue base retraction;Reduced laryngeal elevation;Pharyngeal residue - valleculae;Reduced anterior laryngeal mobility;Reduced epiglottic inversion Pharyngeal Material enters airway, passes BELOW cords without attempt by patient to eject out (silent aspiration) Pharyngeal- Thin Cup Penetration/Aspiration before swallow;Reduced laryngeal elevation;Reduced anterior laryngeal mobility;Reduced tongue base retraction;Pharyngeal residue - valleculae Pharyngeal Material enters airway, passes BELOW cords and not  ejected out despite cough attempt by patient Pharyngeal- Thin Straw -- Pharyngeal -- Pharyngeal- Puree Penetration/Aspiration before swallow;Reduced tongue base retraction;Reduced laryngeal elevation;Reduced anterior laryngeal mobility;Pharyngeal residue - valleculae Pharyngeal Material enters airway, passes BELOW cords without attempt by patient to eject out (silent aspiration) Pharyngeal- Mechanical Soft -- Pharyngeal -- Pharyngeal- Regular -- Pharyngeal -- Pharyngeal- Multi-consistency -- Pharyngeal -- Pharyngeal- Pill -- Pharyngeal -- Pharyngeal Comment --  CHL IP CERVICAL ESOPHAGEAL PHASE 12/17/2017 Cervical Esophageal Phase WFL Pudding Teaspoon -- Pudding Cup -- Honey Teaspoon -- Honey Cup -- Nectar Teaspoon -- Nectar Cup -- Nectar Straw -- Thin Teaspoon -- Thin Cup -- Thin Straw -- Puree -- Mechanical Soft -- Regular -- Multi-consistency -- Pill -- Cervical Esophageal Comment -- No flowsheet data found. Maxcine Ham 12/17/2017, 5:31 PM  Maxcine Ham, M.A. CCC-SLP [phone]               Not all labs, radiology exams or other studies done during hospitalization come through on my EPIC note; however they are reviewed by me.    Assessment and Plan  Glaucoma- plan to continue patient's drops, brimonidine 0.2% 1 drop both eyes twice daily and Timoptic 0.5% 1 drop to each eye twice daily and latanoprost 0.005% 1 drop each eye nightly  History of atrial fibrillation-patient is on no medications for this and appears stable; will monitor  Cardiomegaly-same story patient is on no medications for this and appears stable  Dyspepsia-calcium carbonate 500 mg 2 tablets every 4 hours as needed  Dysphagia/h/o aspiration PNA-patient will probably want to eat comfort foods with which I agree  Which we will not restart at this time patient was on Haldol As needed and Ativan as needed, she does not seem to need them; we will continue to monitor as  Hospice patient- continue supportive care  Time  spent greater than 35 minutes'> 50% of time with patient was spent reviewing records, labs, tests and studies, counseling and developing plan of care   Thurston Hole D. Natalie Hollingshead, MD

## 2018-01-04 ENCOUNTER — Encounter: Payer: Self-pay | Admitting: Internal Medicine

## 2018-01-04 DIAGNOSIS — R1013 Epigastric pain: Secondary | ICD-10-CM | POA: Insufficient documentation

## 2018-01-04 DIAGNOSIS — Z515 Encounter for palliative care: Secondary | ICD-10-CM | POA: Insufficient documentation

## 2018-01-04 DIAGNOSIS — H409 Unspecified glaucoma: Secondary | ICD-10-CM | POA: Insufficient documentation

## 2018-01-05 LAB — BASIC METABOLIC PANEL
BUN: 12 (ref 4–21)
CREATININE: 0.9 (ref 0.5–1.1)
GLUCOSE: 94
Potassium: 4.2 (ref 3.4–5.3)
SODIUM: 141 (ref 137–147)

## 2018-01-05 LAB — CBC AND DIFFERENTIAL
HEMATOCRIT: 33 — AB (ref 36–46)
Hemoglobin: 10.9 — AB (ref 12.0–16.0)
PLATELETS: 258 (ref 150–399)
WBC: 5.8

## 2018-02-06 ENCOUNTER — Non-Acute Institutional Stay (SKILLED_NURSING_FACILITY): Payer: Medicare Other | Admitting: Internal Medicine

## 2018-02-06 ENCOUNTER — Encounter: Payer: Self-pay | Admitting: Internal Medicine

## 2018-02-06 DIAGNOSIS — H409 Unspecified glaucoma: Secondary | ICD-10-CM | POA: Diagnosis not present

## 2018-02-06 DIAGNOSIS — I4891 Unspecified atrial fibrillation: Secondary | ICD-10-CM | POA: Diagnosis not present

## 2018-02-06 DIAGNOSIS — I2584 Coronary atherosclerosis due to calcified coronary lesion: Secondary | ICD-10-CM

## 2018-02-06 DIAGNOSIS — I2510 Atherosclerotic heart disease of native coronary artery without angina pectoris: Secondary | ICD-10-CM | POA: Diagnosis not present

## 2018-03-01 ENCOUNTER — Encounter: Payer: Self-pay | Admitting: Internal Medicine

## 2018-03-01 DIAGNOSIS — M810 Age-related osteoporosis without current pathological fracture: Secondary | ICD-10-CM | POA: Insufficient documentation

## 2018-03-01 NOTE — Progress Notes (Signed)
Location:  [facility] Room Number: [id number] Place of Service:  SNF (31)  Provider:Anne D Alexander MD  Patient, No Pcp Per  Patient Care Team: Patient, No Pcp Per as PCP - General (General Practice)  Extended Emergency Contact Information Primary Emergency Contact: Levy Pupa Mobile Phone: [phone] Relation: Son    Allergies: Patient has no known allergies.  Chief Complaint  Patient presents with  . Medical Management of Chronic Issues    HPI: Patient is 84 y.o. female hospice patient who is being seen for routine issues of glaucoma, coronary artery disease, and atrial fib.  Past Medical History:  Diagnosis Date  . A-fib (HCC) 12/16/2017  . Aspiration pneumonia (HCC) 12/16/2017  . Dysphagia   . Glaucoma 2019  . Pregnancy 1948   G4 P4004    Past Surgical History:  Procedure Laterality Date  . TONSILLECTOMY     as a child    Allergies as of 02/06/2018   No Known Allergies     Medication List        Accurate as of 02/06/18 11:59 PM. Always use your most recent med list.          acetaminophen 325 MG tablet Commonly known as:  TYLENOL Take 650 mg by mouth every 6 (six) hours as needed.   brimonidine 0.2 % ophthalmic solution Commonly known as:  ALPHAGAN Place 1 drop into both eyes 2 (two) times daily.   calcium carbonate 500 MG chewable tablet Commonly known as:  TUMS - dosed in mg elemental calcium Chew 2 tablets by mouth every 4 (four) hours as needed for indigestion or heartburn.   dextromethorphan-guaiFENesin 10-100 MG/5ML liquid Commonly known as:  ROBITUSSIN-DM Take 15 mLs by mouth every 6 (six) hours as needed for cough.   latanoprost 0.005 % ophthalmic solution Commonly known as:  XALATAN Place 1 drop into both eyes at bedtime.   timolol 0.5 % ophthalmic solution Commonly known as:  TIMOPTIC Place 1 drop into both eyes 2 (two) times daily.       No orders of the defined types were placed in this  encounter.    There is no immunization history on file for this patient.  Social History   Tobacco Use  . Smoking status: Never Smoker  . Smokeless tobacco: Never Used  Substance Use Topics  . Alcohol use: No    Frequency: Never    Review of Systems  DATA OBTAINED: from patient, nurse, medical record, family member GENERAL:  no fevers, fatigue, appetite changes SKIN: No itching, rash HEENT: No complaint RESPIRATORY: No cough, wheezing, SOB CARDIAC: No chest pain, palpitations, lower extremity edema  GI: No abdominal pain, No N/V/D or constipation, No heartburn or reflux  GU: No dysuria, frequency or urgency, or incontinence  MUSCULOSKELETAL: No unrelieved bone/joint pain NEUROLOGIC: No headache, dizziness  PSYCHIATRIC: No overt anxiety or sadness  Vitals:   02/06/18 1502  BP: (!) 86/54  Pulse: 89  Resp: 18  Temp: 98.2 F (36.8 C)  SpO2: 96%   Body mass index is 18.29 kg/m. Physical Exam  GENERAL APPEARANCE: Alert, conversant, No acute distress  SKIN: No diaphoresis rash HEENT: Unremarkable RESPIRATORY: Breathing is even, unlabored. Lung sounds are clear   CARDIOVASCULAR: Heart RRR 4/6 high-pitched systolic murmur, no rubs or gallops. No peripheral edema  GASTROINTESTINAL: Abdomen is soft, non-tender, not distended w/ normal bowel sounds.  GENITOURINARY: Bladder non tender, not distended  MUSCULOSKELETAL: No abnormal joints or musculature NEUROLOGIC: Cranial nerves 2-12  grossly intact. Moves all extremities PSYCHIATRIC: Mood and affect appropriate to situation, no behavioral issues  Patient Active Problem List   Diagnosis Date Noted  . Osteoporosis 03/01/2018  . Glaucoma 01/04/2018  . Dyspepsia 01/04/2018  . Hospice care patient 01/04/2018  . Palliative care by specialist   . Pressure injury of skin 12/18/2017  . Aspiration pneumonia (HCC) 12/16/2017  . Atrial fibrillation with RVR (HCC) 12/16/2017  . Dysphagia 12/16/2017  . Elevated random blood  glucose level 12/16/2017  . Acute kidney injury superimposed on CKD (HCC) 12/16/2017  . Goals of care, counseling/discussion 12/16/2017  . Bronchiectasis (HCC) 12/16/2017  . Coronary atherosclerosis due to calcified coronary lesion of native artery 12/16/2017  . Cardiomegaly 12/16/2017  . Acute respiratory failure with hypoxemia (HCC) 12/16/2017  . A-fib (HCC) 12/16/2017    CMP     Component Value Date/Time   NA 141 01/05/2018   K 4.2 01/05/2018   CL 111 12/19/2017 0236   CO2 19 (L) 12/19/2017 0236   GLUCOSE 153 (H) 12/19/2017 0236   BUN 12 01/05/2018   CREATININE 0.9 01/05/2018   CREATININE 0.95 12/19/2017 0236   CALCIUM 7.9 (L) 12/19/2017 0236   PROT 7.7 12/16/2017 1538   ALBUMIN 3.9 12/16/2017 1538   AST 85 (H) 12/16/2017 1538   ALT 32 12/16/2017 1538   ALKPHOS 93 12/16/2017 1538   BILITOT 1.1 12/16/2017 1538   GFRNONAA 50 (L) 12/19/2017 0236   GFRAA 58 (L) 12/19/2017 0236   Recent Labs    12/17/17 0249 12/18/17 0451 12/19/17 0236 01/05/18  NA 139 143 140 141  K 3.8 3.9 3.1* 4.2  CL 109 111 111  --   CO2 18* 22 19*  --   GLUCOSE 145* 95 153*  --   BUN 24* 23* 17 12  CREATININE 1.16* 1.16* 0.95 0.9  CALCIUM 8.4* 8.9 7.9*  --    Recent Labs    12/16/17 1538  AST 85*  ALT 32  ALKPHOS 93  BILITOT 1.1  PROT 7.7  ALBUMIN 3.9   Recent Labs    12/17/17 0249 12/18/17 0451 12/19/17 0236 01/05/18  WBC 6.0 6.9 7.1 5.8  HGB 11.3* 11.3* 10.4* 10.9*  HCT 32.8* 34.0* 31.3* 33*  MCV 88.9 92.1 91.8  --   PLT 196 201 192 258   No results for input(s): CHOL, LDLCALC, TRIG in the last 8760 hours.  Invalid input(s): HCL No results found for: MICROALBUR No results found for: TSH No results found for: HGBA1C No results found for: CHOL, HDL, LDLCALC, LDLDIRECT, TRIG, CHOLHDL  Significant Diagnostic Results in last 30 days:  No results found.  Assessment and Plan  Glaucoma Stable; continues Xalantan 0.005% 1 drop at bedtime, timolol 0.5% 1 drop twice daily and  Alphagan 0.5% 1 drop twice daily  Coronary atherosclerosis due to calcified coronary lesion of native artery Patient is hospice care now and off of all medications; will monitor for signs or symptoms of chest pain or heart problems  A-fib [facility]) Patient is hospice and is on no medications for rate or prophylaxis but rate appears controlled; will monitor for problems     Merrilee Seashore, MD

## 2018-03-01 NOTE — Assessment & Plan Note (Signed)
Patient is hospice and is on no medications for rate or prophylaxis but rate appears controlled; will monitor for problems

## 2018-03-01 NOTE — Assessment & Plan Note (Signed)
Stable; continues Xalantan 0.005% 1 drop at bedtime, timolol 0.5% 1 drop twice daily and Alphagan 0.5% 1 drop twice daily

## 2018-03-01 NOTE — Assessment & Plan Note (Signed)
Patient is hospice care now and off of all medications; will monitor for signs or symptoms of chest pain or heart problems

## 2018-03-06 ENCOUNTER — Encounter: Payer: Self-pay | Admitting: Internal Medicine

## 2018-03-06 ENCOUNTER — Non-Acute Institutional Stay (SKILLED_NURSING_FACILITY): Payer: Medicare Other | Admitting: Internal Medicine

## 2018-03-06 DIAGNOSIS — I4891 Unspecified atrial fibrillation: Secondary | ICD-10-CM | POA: Diagnosis not present

## 2018-03-06 DIAGNOSIS — I2510 Atherosclerotic heart disease of native coronary artery without angina pectoris: Secondary | ICD-10-CM | POA: Diagnosis not present

## 2018-03-06 DIAGNOSIS — I2584 Coronary atherosclerosis due to calcified coronary lesion: Secondary | ICD-10-CM | POA: Diagnosis not present

## 2018-03-06 DIAGNOSIS — H409 Unspecified glaucoma: Secondary | ICD-10-CM

## 2018-03-06 NOTE — Progress Notes (Signed)
Location:  [facility] Room Number: 415-W Place of Service:  SNF ([id number])  Margit Hanks, MD  Patient Care Team: Margit Hanks, MD as PCP - General (Internal Medicine)  Extended Emergency Contact Information Primary Emergency Contact: Levy Pupa Mobile Phone: [phone] Relation: Son    Allergies: Patient has no known allergies.  Chief Complaint  Patient presents with  . Medical Management of Chronic Issues    Routine Visit   . Health Maintenance    Pneumonia vacc due    HPI: Patient is 84 y.o. female who is being seen for routine issues of glaucoma, atrial fibrillation, and coronary artery disease.  Past Medical History:  Diagnosis Date  . A-fib (HCC) 12/16/2017  . Aspiration pneumonia (HCC) 12/16/2017  . Dysphagia   . Glaucoma 2019  . Pregnancy 1950   G4 P4004    Past Surgical History:  Procedure Laterality Date  . TONSILLECTOMY     as a child    Allergies as of 03/06/2018   No Known Allergies     Medication List        Accurate as of 03/06/18 11:59 PM. Always use your most recent med list.          acetaminophen 325 MG tablet Commonly known as:  TYLENOL Take 650 mg by mouth every 6 (six) hours as needed.   brimonidine 0.2 % ophthalmic solution Commonly known as:  ALPHAGAN Place 1 drop into both eyes 2 (two) times daily.   calcium carbonate 500 MG chewable tablet Commonly known as:  TUMS - dosed in mg elemental calcium Chew 2 tablets by mouth every 4 (four) hours as needed for indigestion or heartburn.   dextromethorphan-guaiFENesin 10-100 MG/5ML liquid Commonly known as:  ROBITUSSIN-DM Take 15 mLs by mouth every 6 (six) hours as needed for cough.   latanoprost 0.005 % ophthalmic solution Commonly known as:  XALATAN Place 1 drop into both eyes at bedtime.   timolol 0.5 % ophthalmic solution Commonly known as:  TIMOPTIC Place 1 drop into both eyes 2 (two) times daily.       No orders of the  defined types were placed in this encounter.    There is no immunization history on file for this patient.  Social History   Tobacco Use  . Smoking status: Never Smoker  . Smokeless tobacco: Never Used  Substance Use Topics  . Alcohol use: No    Frequency: Never    Review of Systems  DATA OBTAINED: from patient, nurse GENERAL:  no fevers, fatigue, appetite changes SKIN: No itching, rash HEENT: No complaint RESPIRATORY: No cough, wheezing, SOB CARDIAC: No chest pain, palpitations, lower extremity edema  GI: No abdominal pain, No N/V/D or constipation, No heartburn or reflux  GU: No dysuria, frequency or urgency, or incontinence  MUSCULOSKELETAL: No unrelieved bone/joint pain NEUROLOGIC: No headache, dizziness  PSYCHIATRIC: No overt anxiety or sadness  Vitals:   03/06/18 1434  BP: 102/63  Pulse: 74  Resp: 18  Temp: (!) 97.3 F (36.3 C)   Body mass index is 17.76 kg/m. Physical Exam  GENERAL APPEARANCE: Alert, conversant, No acute distress  SKIN: No diaphoresis rash HEENT: Unremarkable RESPIRATORY: Breathing is even, unlabored. Lung sounds are clear   CARDIOVASCULAR: Heart RRR no murmurs, rubs or gallops. No peripheral edema  GASTROINTESTINAL: Abdomen is soft, non-tender, not distended w/ normal bowel sounds.  GENITOURINARY: Bladder non tender, not distended  MUSCULOSKELETAL: No abnormal joints or musculature NEUROLOGIC: Cranial nerves  2-12 grossly intact. Moves all extremities PSYCHIATRIC: Mood and affect appropriate to situation, no behavioral issues  Patient Active Problem List   Diagnosis Date Noted  . Osteoporosis 03/01/2018  . Glaucoma 01/04/2018  . Dyspepsia 01/04/2018  . Hospice care patient 01/04/2018  . Palliative care by specialist   . Pressure injury of skin 12/18/2017  . Aspiration pneumonia (HCC) 12/16/2017  . Atrial fibrillation with RVR (HCC) 12/16/2017  . Dysphagia 12/16/2017  . Elevated random blood glucose level 12/16/2017  . Acute  kidney injury superimposed on CKD (HCC) 12/16/2017  . Goals of care, counseling/discussion 12/16/2017  . Bronchiectasis (HCC) 12/16/2017  . Coronary atherosclerosis due to calcified coronary lesion of native artery 12/16/2017  . Cardiomegaly 12/16/2017  . Acute respiratory failure with hypoxemia (HCC) 12/16/2017  . A-fib (HCC) 12/16/2017    CMP     Component Value Date/Time   NA 141 01/05/2018   K 4.2 01/05/2018   CL 111 12/19/2017 0236   CO2 19 (L) 12/19/2017 0236   GLUCOSE 153 (H) 12/19/2017 0236   BUN 12 01/05/2018   CREATININE 0.9 01/05/2018   CREATININE 0.95 12/19/2017 0236   CALCIUM 7.9 (L) 12/19/2017 0236   PROT 7.7 12/16/2017 1538   ALBUMIN 3.9 12/16/2017 1538   AST 85 (H) 12/16/2017 1538   ALT 32 12/16/2017 1538   ALKPHOS 93 12/16/2017 1538   BILITOT 1.1 12/16/2017 1538   GFRNONAA 50 (L) 12/19/2017 0236   GFRAA 58 (L) 12/19/2017 0236   Recent Labs    12/17/17 0249 12/18/17 0451 12/19/17 0236 01/05/18  NA 139 143 140 141  K 3.8 3.9 3.1* 4.2  CL 109 111 111  --   CO2 18* 22 19*  --   GLUCOSE 145* 95 153*  --   BUN 24* 23* 17 12  CREATININE 1.16* 1.16* 0.95 0.9  CALCIUM 8.4* 8.9 7.9*  --    Recent Labs    12/16/17 1538  AST 85*  ALT 32  ALKPHOS 93  BILITOT 1.1  PROT 7.7  ALBUMIN 3.9   Recent Labs    12/17/17 0249 12/18/17 0451 12/19/17 0236 01/05/18  WBC 6.0 6.9 7.1 5.8  HGB 11.3* 11.3* 10.4* 10.9*  HCT 32.8* 34.0* 31.3* 33*  MCV 88.9 92.1 91.8  --   PLT 196 201 192 258   No results for input(s): CHOL, LDLCALC, TRIG in the last 8760 hours.  Invalid input(s): HCL No results found for: MICROALBUR No results found for: TSH No results found for: HGBA1C No results found for: CHOL, HDL, LDLCALC, LDLDIRECT, TRIG, CHOLHDL  Significant Diagnostic Results in last 30 days:  No results found.  Assessment and Plan  Glaucoma Stable; continue Alphagan 0.5% 1 drop twice daily, timolol 0.5% 1 drop twice daily and Zilactin 0.005% 1 drop  nightly  A-fib (HCC) Patient on no medications for rate or prophylaxis secondary to being hospice status but rate appears controlled; will monitor  Coronary atherosclerosis due to calcified coronary lesion of native artery Patient is off all medications secondary to hospice status; there have been no symptoms of chest pain; will monitor     Anne D. [name], MD

## 2018-03-27 ENCOUNTER — Non-Acute Institutional Stay (SKILLED_NURSING_FACILITY): Payer: Medicare Other

## 2018-03-27 DIAGNOSIS — Z0000 Encounter for general adult medical examination without abnormal findings: Secondary | ICD-10-CM

## 2018-03-27 NOTE — Progress Notes (Signed)
Subjective:   Natalie Holt is a 84 y.o. female who presents for an Initial Medicare Annual Wellness Visit at [facility], hospice patient    Objective:    Today's Vitals   03/27/18 1400  BP: (!) 115/58  Pulse: 97  Temp: 97.9 F (36.6 C)  TempSrc: Oral  SpO2: 96%  Weight: 113 lb (51.3 kg)  Height: 5\' 7"  (1.702 m)   Body mass index is 17.7 kg/m.  Advanced Directives 03/27/2018 02/06/2018 01/01/2018 12/16/2017  Does Patient Have a Medical Advance Directive? Yes Yes No No  Type of Advance Directive Out of facility DNR (pink MOST or yellow form) Out of facility DNR (pink MOST or yellow form) - -  Does patient want to make changes to medical advance directive? No - Patient declined No - Patient declined - -  Would patient like information on creating a medical advance directive? - - - No - Patient declined  Pre-existing out of facility DNR order (yellow form or pink MOST form) Yellow form placed in chart (order not valid for inpatient use) - - -    Current Medications (verified) Outpatient Encounter Medications as of 03/27/2018  Medication Sig  . acetaminophen (TYLENOL) 325 MG tablet Take 650 mg by mouth every 6 (six) hours as needed.  . brimonidine (ALPHAGAN) 0.2 % ophthalmic solution Place 1 drop into both eyes 2 (two) times daily.  . calcium carbonate (TUMS - DOSED IN MG ELEMENTAL CALCIUM) 500 MG chewable tablet Chew 2 tablets by mouth every 4 (four) hours as needed for indigestion or heartburn.   . dextromethorphan-guaiFENesin (ROBITUSSIN-DM) 10-100 MG/5ML liquid Take 15 mLs by mouth every 6 (six) hours as needed for cough.  . latanoprost (XALATAN) 0.005 % ophthalmic solution Place 1 drop into both eyes at bedtime.  . timolol (TIMOPTIC) 0.5 % ophthalmic solution Place 1 drop into both eyes 2 (two) times daily.   No facility-administered encounter medications on file as of 03/27/2018.     Allergies (verified) Patient has no known allergies.   History: Past Medical  History:  Diagnosis Date  . A-fib (HCC) 12/16/2017  . Aspiration pneumonia (HCC) 12/16/2017  . Dysphagia   . Glaucoma 2019  . Pregnancy 1950   G4 P4004   Past Surgical History:  Procedure Laterality Date  . TONSILLECTOMY     as a child   Family History  Problem Relation Age of Onset  . Bowel Disease Daughter   . Stroke Son   . Other Son    Social History   Socioeconomic History  . Marital status: Widowed    Spouse name: Not on file  . Number of children: Not on file  . Years of education: Not on file  . Highest education level: Not on file  Occupational History  . Not on file  Social Needs  . Financial resource strain: Not hard at all  . Food insecurity:    Worry: Never true    Inability: Never true  . Transportation needs:    Medical: No    Non-medical: No  Tobacco Use  . Smoking status: Never Smoker  . Smokeless tobacco: Never Used  Substance and Sexual Activity  . Alcohol use: No    Frequency: Never  . Drug use: No  . Sexual activity: Not Currently  Lifestyle  . Physical activity:    Days per week: 0 days    Minutes per session: 0 min  . Stress: Not at all  Relationships  . Social  connections:    Talks on phone: More than three times a week    Gets together: Three times a week    Attends religious service: Never    Active member of club or organization: No    Attends meetings of clubs or organizations: Never    Relationship status: Divorced  Other Topics Concern  . Not on file  Social History Narrative   Lives alone on second story    Tobacco Counseling Counseling given: Not Answered   Clinical Intake:  Pre-visit preparation completed: No  Pain : No/denies pain     Nutritional Risks: None Diabetes: No  How often do you need to have someone help you when you read instructions, pamphlets, or other written materials from your doctor or pharmacy?: 2 - Rarely  Interpreter Needed?: No  Information entered by :: Tyron Russell,  RN   Activities of Daily Living In your present state of health, do you have any difficulty performing the following activities: 03/27/2018 12/16/2017  Hearing? N N  Vision? N N  Difficulty concentrating or making decisions? Y N  Walking or climbing stairs? Y Y  Dressing or bathing? Y Y  Doing errands, shopping? Y N  Preparing Food and eating ? Y -  Using the Toilet? Y -  In the past six months, have you accidently leaked urine? Y -  Do you have problems with loss of bowel control? Y -  Managing your Medications? Y -  Managing your Finances? Y -  Housekeeping or managing your Housekeeping? Y -     Immunizations and Health Maintenance  There is no immunization history on file for this patient. There are no preventive care reminders to display for this patient.  Patient Care Team: Natalie Hanks, MD as PCP - General (Internal Medicine)  Indicate any recent Medical Services you may have received from other than [facility] providers in the past year (date may be approximate).     Assessment:   This is a routine wellness examination for Natalie Holt.  Hearing/Vision screen No exam data present  Dietary issues and exercise activities discussed: Current Exercise Habits: The patient does not participate in regular exercise at present, Exercise limited by: orthopedic condition(s)  Goals    None     Depression Screen PHQ 2/9 Scores 03/27/2018  PHQ - 2 Score 1    Fall Risk Fall Risk  03/27/2018  Falls in the past year? Yes  Number falls in past yr: 1  Injury with Fall? No    Is the patient's home free of loose throw rugs in walkways, pet beds, electrical cords, etc?   yes      Grab bars in the bathroom? yes      Handrails on the stairs?   yes      Adequate lighting?   yes  Timed Get Up and Go Performed: unable to perform, pt is unambulatory  Cognitive Function:     6CIT Screen 03/27/2018  What Year? 4 points  What month? 3 points  What time? 0 points  Count back from 20 0  points  Months in reverse 0 points  Repeat phrase 8 points  Total Score 15    Screening Tests Health Maintenance  Topic Date Due  . PNA vac Low Risk Adult (1 of 2 - PCV13) 04/06/2018 (Originally 10/19/1989)  . DEXA SCAN  03/07/2023 (Originally 10/19/1989)  . TETANUS/TDAP  03/07/2023 (Originally 10/20/1941)  . INFLUENZA VACCINE  05/14/2018    Qualifies for Shingles Vaccine? Not  in past records  Cancer Screenings: Lung: Low Dose CT Chest recommended if Age 58-80 years, 30 pack-year currently smoking OR have quit w/in 15years. Patient does not qualify. Breast: Up to date on Mammogram? Yes   Up to date of Bone Density/Dexa? No, not ordered, hospice patient Colorectal: up to date  Additional Screenings:  Hepatitis C Screening: declined PNA 13 due- not ordered, hospice patient TDAP due-not ordered, hospice patient     Plan:    I have personally reviewed and addressed the Medicare Annual Wellness questionnaire and have noted the following in the patient's chart:  A. Medical and social history B. Use of alcohol, tobacco or illicit drugs  C. Current medications and supplements D. Functional ability and status E.  Nutritional status F.  Physical activity G. Advance directives H. List of other physicians I.  Hospitalizations, surgeries, and ER visits in previous 12 months J.  Vitals K. Screenings to include hearing, vision, cognitive, depression L. Referrals and appointments - none  In addition, I have reviewed and discussed with patient certain preventive protocols, quality metrics, and best practice recommendations. A written personalized care plan for preventive services as well as general preventive health recommendations were provided to patient.  See attached scanned questionnaire for additional information.   Signed,   Tyron Russell, RN Nurse Health Advisor  Patient Concerns: None

## 2018-03-27 NOTE — Patient Instructions (Signed)
Natalie Holt , Thank you for taking time to come for your Medicare Wellness Visit. I appreciate your ongoing commitment to your health goals. Please review the following plan we discussed and let me know if I can assist you in the future.   Screening recommendations/referrals: Colonoscopy excluded, over age 775 Mammogram excluded, over age 82 Bone Density due, not ordered because she is a hospice patient Recommended yearly ophthalmology/optometry visit for glaucoma screening and checkup Recommended yearly dental visit for hygiene and checkup  Vaccinations: Influenza vaccine due 2019 fall season Pneumococcal vaccine due, not ordered because she is a hospice patient Tdap vaccine due, not ordered because she is a hospice patient Shingles vaccine due, not ordered because she is a hospice patient  Advanced directives: In chart  Conditions/risks identified: none  Next appointment: Dr. Lyn HollingsheadAlexander makes rounds   Preventive Care 65 Years and Older, Female Preventive care refers to lifestyle choices and visits with your health care provider that can promote health and wellness. What does preventive care include?  A yearly physical exam. This is also called an annual well check.  Dental exams once or twice a year.  Routine eye exams. Ask your health care provider how often you should have your eyes checked.  Personal lifestyle choices, including:  Daily care of your teeth and gums.  Regular physical activity.  Eating a healthy diet.  Avoiding tobacco and drug use.  Limiting alcohol use.  Practicing safe sex.  Taking low-dose aspirin every day.  Taking vitamin and mineral supplements as recommended by your health care provider. What happens during an annual well check? The services and screenings done by your health care provider during your annual well check will depend on your age, overall health, lifestyle risk factors, and family history of disease. Counseling  Your health  care provider may ask you questions about your:  Alcohol use.  Tobacco use.  Drug use.  Emotional well-being.  Home and relationship well-being.  Sexual activity.  Eating habits.  History of falls.  Memory and ability to understand (cognition).  Work and work Astronomerenvironment.  Reproductive health. Screening  You may have the following tests or measurements:  Height, weight, and BMI.  Blood pressure.  Lipid and cholesterol levels. These may be checked every 5 years, or more frequently if you are over 82 years old.  Skin check.  Lung cancer screening. You may have this screening every year starting at age 82 if you have a 30-pack-year history of smoking and currently smoke or have quit within the past 15 years.  Fecal occult blood test (FOBT) of the stool. You may have this test every year starting at age 82.  Flexible sigmoidoscopy or colonoscopy. You may have a sigmoidoscopy every 5 years or a colonoscopy every 10 years starting at age 82.  Hepatitis C blood test.  Hepatitis B blood test.  Sexually transmitted disease (STD) testing.  Diabetes screening. This is done by checking your blood sugar (glucose) after you have not eaten for a while (fasting). You may have this done every 1-3 years.  Bone density scan. This is done to screen for osteoporosis. You may have this done starting at age 82.  Mammogram. This may be done every 1-2 years. Talk to your health care provider about how often you should have regular mammograms. Talk with your health care provider about your test results, treatment options, and if necessary, the need for more tests. Vaccines  Your health care provider may recommend certain vaccines, such as:  Influenza vaccine. This is recommended every year.  Tetanus, diphtheria, and acellular pertussis (Tdap, Td) vaccine. You may need a Td booster every 10 years.  Zoster vaccine. You may need this after age 19.  Pneumococcal 13-valent conjugate  (PCV13) vaccine. One dose is recommended after age 33.  Pneumococcal polysaccharide (PPSV23) vaccine. One dose is recommended after age 84. Talk to your health care provider about which screenings and vaccines you need and how often you need them. This information is not intended to replace advice given to you by your health care provider. Make sure you discuss any questions you have with your health care provider. Document Released: 10/27/2015 Document Revised: 06/19/2016 Document Reviewed: 08/01/2015 Elsevier Interactive Patient Education  2017 [employer] Prevention in the Home Falls can cause injuries. They can happen to people of all ages. There are many things you can do to make your home safe and to help prevent falls. What can I do on the outside of my home?  Regularly fix the edges of walkways and driveways and fix any cracks.  Remove anything that might make you trip as you walk through a door, such as a raised step or threshold.  Trim any bushes or trees on the path to your home.  Use bright outdoor lighting.  Clear any walking paths of anything that might make someone trip, such as rocks or tools.  Regularly check to see if handrails are loose or broken. Make sure that both sides of any steps have handrails.  Any raised decks and porches should have guardrails on the edges.  Have any leaves, snow, or ice cleared regularly.  Use sand or salt on walking paths during winter.  Clean up any spills in your garage right away. This includes oil or grease spills. What can I do in the bathroom?  Use night lights.  Install grab bars by the toilet and in the tub and shower. Do not use towel bars as grab bars.  Use non-skid mats or decals in the tub or shower.  If you need to sit down in the shower, use a plastic, non-slip stool.  Keep the floor dry. Clean up any water that spills on the floor as soon as it happens.  Remove soap buildup in the tub or shower  regularly.  Attach bath mats securely with double-sided non-slip rug tape.  Do not have throw rugs and other things on the floor that can make you trip. What can I do in the bedroom?  Use night lights.  Make sure that you have a light by your bed that is easy to reach.  Do not use any sheets or blankets that are too big for your bed. They should not hang down onto the floor.  Have a firm chair that has side arms. You can use this for support while you get dressed.  Do not have throw rugs and other things on the floor that can make you trip. What can I do in the kitchen?  Clean up any spills right away.  Avoid walking on wet floors.  Keep items that you use a lot in easy-to-reach places.  If you need to reach something above you, use a strong step stool that has a grab bar.  Keep electrical cords out of the way.  Do not use floor polish or wax that makes floors slippery. If you must use wax, use non-skid floor wax.  Do not have throw rugs and other things on the floor that  can make you trip. What can I do with my stairs?  Do not leave any items on the stairs.  Make sure that there are handrails on both sides of the stairs and use them. Fix handrails that are broken or loose. Make sure that handrails are as long as the stairways.  Check any carpeting to make sure that it is firmly attached to the stairs. Fix any carpet that is loose or worn.  Avoid having throw rugs at the top or bottom of the stairs. If you do have throw rugs, attach them to the floor with carpet tape.  Make sure that you have a light switch at the top of the stairs and the bottom of the stairs. If you do not have them, ask someone to add them for you. What else can I do to help prevent falls?  Wear shoes that:  Do not have high heels.  Have rubber bottoms.  Are comfortable and fit you well.  Are closed at the toe. Do not wear sandals.  If you use a stepladder:  Make sure that it is fully  opened. Do not climb a closed stepladder.  Make sure that both sides of the stepladder are locked into place.  Ask someone to hold it for you, if possible.  Clearly mark and make sure that you can see:  Any grab bars or handrails.  First and last steps.  Where the edge of each step is.  Use tools that help you move around (mobility aids) if they are needed. These include:  Canes.  Walkers.  Scooters.  Crutches.  Turn on the lights when you go into a dark area. Replace any light bulbs as soon as they burn out.  Set up your furniture so you have a clear path. Avoid moving your furniture around.  If any of your floors are uneven, fix them.  If there are any pets around you, be aware of where they are.  Review your medicines with your doctor. Some medicines can make you feel dizzy. This can increase your chance of falling. Ask your doctor what other things that you can do to help prevent falls. This information is not intended to replace advice given to you by your health care provider. Make sure you discuss any questions you have with your health care provider. Document Released: 07/27/2009 Document Revised: 03/07/2016 Document Reviewed: 11/04/2014 Elsevier Interactive Patient Education  2017 [employer].

## 2018-03-30 ENCOUNTER — Encounter: Payer: Self-pay | Admitting: Internal Medicine

## 2018-03-30 ENCOUNTER — Non-Acute Institutional Stay (SKILLED_NURSING_FACILITY): Payer: Medicare Other | Admitting: Internal Medicine

## 2018-03-30 DIAGNOSIS — R1013 Epigastric pain: Secondary | ICD-10-CM | POA: Diagnosis not present

## 2018-03-30 DIAGNOSIS — I517 Cardiomegaly: Secondary | ICD-10-CM | POA: Diagnosis not present

## 2018-03-30 DIAGNOSIS — Z515 Encounter for palliative care: Secondary | ICD-10-CM | POA: Diagnosis not present

## 2018-03-30 NOTE — Progress Notes (Signed)
[facility] Room Number: [phone] Place of Service:  SNF ([id number])  Margit HanksAlexander, Jadelynn Boylan D, MD  Patient Care Team: Margit HanksAlexander, Oniya Mandarino D, MD as PCP - General (Internal Medicine)  Extended Emergency Contact Information Primary Emergency Contact: Levy PupaWade, Guy Mobile Phone: [phone] Relation: Son    Allergies: Patient has no known allergies.  Chief Complaint  Patient presents with  . Medical Management of Chronic Issues    Routine Visit    HPI: Patient is 84 y.o. female who is being seen for routine issues of dyspepsia, hospice care, and cardiomegaly.  Past Medical History:  Diagnosis Date  . A-fib (HCC) 12/16/2017  . Aspiration pneumonia (HCC) 12/16/2017  . Dysphagia   . Glaucoma 2019  . Pregnancy 1948   G4 P4004    Past Surgical History:  Procedure Laterality Date  . TONSILLECTOMY     as a child    Allergies as of 03/30/2018   No Known Allergies     Medication List        Accurate as of 03/30/18 11:59 PM. Always use your most recent med list.          acetaminophen 325 MG tablet Commonly known as:  TYLENOL Take 650 mg by mouth every 6 (six) hours as needed.   brimonidine 0.2 % ophthalmic solution Commonly known as:  ALPHAGAN Place 1 drop into both eyes 2 (two) times daily.   calcium carbonate 500 MG chewable tablet Commonly known as:  TUMS - dosed in mg elemental calcium Chew 2 tablets by mouth every 4 (four) hours as needed for indigestion or heartburn.   dextromethorphan-guaiFENesin 10-100 MG/5ML liquid Commonly known as:  ROBITUSSIN-DM Take 15 mLs by mouth every 6 (six) hours as needed for cough.   latanoprost 0.005 % ophthalmic solution Commonly known as:  XALATAN Place 1 drop into both eyes at bedtime.   timolol 0.5 % ophthalmic solution Commonly known as:  TIMOPTIC Place 1 drop into both eyes 2 (two) times daily.       No orders of the defined types were placed in this encounter.    There is no  immunization history on file for this patient.  Social History   Tobacco Use  . Smoking status: Never Smoker  . Smokeless tobacco: Never Used  Substance Use Topics  . Alcohol use: No    Frequency: Never    Review of Systems  DATA OBTAINED: from patient, nurse GENERAL:  no fevers, fatigue, appetite changes SKIN: No itching, rash HEENT: No complaint RESPIRATORY: No cough, wheezing, SOB CARDIAC: No chest pain, palpitations, lower extremity edema  GI: No abdominal pain, No N/V/D or constipation, No heartburn or reflux  GU: No dysuria, frequency or urgency, or incontinence  MUSCULOSKELETAL: No unrelieved bone/joint pain NEUROLOGIC: No headache, dizziness  PSYCHIATRIC: No overt anxiety or sadness  Vitals:   03/30/18 1052  BP: 102/70  Pulse: 90  Resp: 16  Temp: (!) 97.1 F (36.2 C)  SpO2: 93%   Body mass index is 17.76 kg/m. Physical Exam  GENERAL APPEARANCE: Alert, conversant, No acute distress  SKIN: No diaphoresis rash HEENT: Unremarkable RESPIRATORY: Breathing is even, unlabored. Lung sounds are clear   CARDIOVASCULAR: Heart RRR 4/6 high-pitched systolic murmur, no rubs or gallops. No peripheral edema  GASTROINTESTINAL: Abdomen is soft, non-tender, not distended w/ normal bowel sounds.  GENITOURINARY: Bladder non tender, not distended  MUSCULOSKELETAL: No abnormal joints or musculature NEUROLOGIC: Cranial nerves 2-12 grossly intact. Moves all extremities PSYCHIATRIC: Mood and  affect appropriate to situation, no behavioral issues  Patient Active Problem List   Diagnosis Date Noted  . Osteoporosis 03/01/2018  . Glaucoma 01/04/2018  . Dyspepsia 01/04/2018  . Hospice care patient 01/04/2018  . Palliative care by specialist   . Pressure injury of skin 12/18/2017  . Aspiration pneumonia (HCC) 12/16/2017  . Atrial fibrillation with RVR (HCC) 12/16/2017  . Dysphagia 12/16/2017  . Elevated random blood glucose level 12/16/2017  . Acute kidney injury superimposed on  CKD (HCC) 12/16/2017  . Goals of care, counseling/discussion 12/16/2017  . Bronchiectasis (HCC) 12/16/2017  . Coronary atherosclerosis due to calcified coronary lesion of native artery 12/16/2017  . Cardiomegaly 12/16/2017  . Acute respiratory failure with hypoxemia (HCC) 12/16/2017  . A-fib (HCC) 12/16/2017    CMP     Component Value Date/Time   NA 141 01/05/2018   K 4.2 01/05/2018   CL 111 12/19/2017 0236   CO2 19 (L) 12/19/2017 0236   GLUCOSE 153 (H) 12/19/2017 0236   BUN 12 01/05/2018   CREATININE 0.9 01/05/2018   CREATININE 0.95 12/19/2017 0236   CALCIUM 7.9 (L) 12/19/2017 0236   PROT 7.7 12/16/2017 1538   ALBUMIN 3.9 12/16/2017 1538   AST 85 (H) 12/16/2017 1538   ALT 32 12/16/2017 1538   ALKPHOS 93 12/16/2017 1538   BILITOT 1.1 12/16/2017 1538   GFRNONAA 50 (L) 12/19/2017 0236   GFRAA 58 (L) 12/19/2017 0236   Recent Labs    12/17/17 0249 12/18/17 0451 12/19/17 0236 01/05/18  NA 139 143 140 141  K 3.8 3.9 3.1* 4.2  CL 109 111 111  --   CO2 18* 22 19*  --   GLUCOSE 145* 95 153*  --   BUN 24* 23* 17 12  CREATININE 1.16* 1.16* 0.95 0.9  CALCIUM 8.4* 8.9 7.9*  --    Recent Labs    12/16/17 1538  AST 85*  ALT 32  ALKPHOS 93  BILITOT 1.1  PROT 7.7  ALBUMIN 3.9   Recent Labs    12/17/17 0249 12/18/17 0451 12/19/17 0236 01/05/18  WBC 6.0 6.9 7.1 5.8  HGB 11.3* 11.3* 10.4* 10.9*  HCT 32.8* 34.0* 31.3* 33*  MCV 88.9 92.1 91.8  --   PLT 196 201 192 258   No results for input(s): CHOL, LDLCALC, TRIG in the last 8760 hours.  Invalid input(s): HCL No results found for: MICROALBUR No results found for: TSH No results found for: HGBA1C No results found for: CHOL, HDL, LDLCALC, LDLDIRECT, TRIG, CHOLHDL  Significant Diagnostic Results in last 30 days:  No results found.  Assessment and Plan  Dyspepsia Stable; continue Tums 500 mg every 4 hours as needed for heartburn  Hospice care patient Patient came to skilled nursing facility after she did not  die at [facility]; patient still wishes to die, is ready to die will continue supportive care  Cardiomegaly She is on no meds by choice, has had no problems; we will continue to monitor and continue supportive care    Thurston Hole D. [name], MD

## 2018-04-05 ENCOUNTER — Encounter: Payer: Self-pay | Admitting: Internal Medicine

## 2018-04-05 NOTE — Assessment & Plan Note (Signed)
Patient is off all medications secondary to hospice status; there have been no symptoms of chest pain; will monitor

## 2018-04-05 NOTE — Assessment & Plan Note (Signed)
Patient on no medications for rate or prophylaxis secondary to being hospice status but rate appears controlled; will monitor

## 2018-04-05 NOTE — Assessment & Plan Note (Signed)
Stable; continue Alphagan 0.5% 1 drop twice daily, timolol 0.5% 1 drop twice daily and Zilactin 0.005% 1 drop nightly

## 2018-04-24 ENCOUNTER — Encounter: Payer: Self-pay | Admitting: Internal Medicine

## 2018-04-24 ENCOUNTER — Non-Acute Institutional Stay (SKILLED_NURSING_FACILITY): Payer: Medicare Other | Admitting: Internal Medicine

## 2018-04-24 DIAGNOSIS — I2584 Coronary atherosclerosis due to calcified coronary lesion: Secondary | ICD-10-CM | POA: Diagnosis not present

## 2018-04-24 DIAGNOSIS — I4891 Unspecified atrial fibrillation: Secondary | ICD-10-CM | POA: Diagnosis not present

## 2018-04-24 DIAGNOSIS — I2510 Atherosclerotic heart disease of native coronary artery without angina pectoris: Secondary | ICD-10-CM

## 2018-04-24 DIAGNOSIS — H409 Unspecified glaucoma: Secondary | ICD-10-CM | POA: Diagnosis not present

## 2018-04-24 NOTE — Assessment & Plan Note (Signed)
Patient came to skilled nursing facility after she did not die at [facility]; patient still wishes to die, is ready to die will continue supportive care

## 2018-04-24 NOTE — Assessment & Plan Note (Signed)
She is on no meds by choice, has had no problems; we will continue to monitor and continue supportive care

## 2018-04-24 NOTE — Progress Notes (Signed)
:  Location:  [facility] Room Number: [phone] Place of Service:  SNF (31)   Natalie Holt. Natalie Hollingshead, MD  Patient Care Team: Natalie Hanks, MD as PCP - General (Internal Medicine)  Extended Emergency Contact Information Primary Emergency Contact: Natalie Holt Mobile Phone: [phone] Relation: Son     Allergies: Patient has no known allergies.  Chief Complaint  Patient presents with  . Medical Management of Chronic Issues    Routine Visit    HPI: Patient is 84 y.o. female who is being seen for routine issues of atrial fibrillation, glaucoma,  Past Medical History:  Diagnosis Date  . A-fib (HCC) 12/16/2017  . Aspiration pneumonia (HCC) 12/16/2017  . Dysphagia   . Glaucoma 2019  . Pregnancy 1950   G4 P4004    Past Surgical History:  Procedure Laterality Date  . TONSILLECTOMY     as a child    Allergies as of 04/24/2018   No Known Allergies     Medication List        Accurate as of 04/24/18 11:59 PM. Always use your most recent med list.          acetaminophen 325 MG tablet Commonly known as:  TYLENOL Take 650 mg by mouth every 6 (six) hours as needed.   brimonidine 0.2 % ophthalmic solution Commonly known as:  ALPHAGAN Place 1 drop into both eyes 2 (two) times daily.   calcium carbonate 500 MG chewable tablet Commonly known as:  TUMS - dosed in mg elemental calcium Chew 2 tablets by mouth every 4 (four) hours as needed for indigestion or heartburn.   dextromethorphan-guaiFENesin 10-100 MG/5ML liquid Commonly known as:  ROBITUSSIN-DM Take 15 mLs by mouth every 6 (six) hours as needed for cough.   latanoprost 0.005 % ophthalmic solution Commonly known as:  XALATAN Place 1 drop into both eyes at bedtime.   nystatin powder Generic drug:  nystatin Apply topically. apply to back QD   timolol 0.5 % ophthalmic solution Commonly known as:  TIMOPTIC Place 1 drop into both eyes 2 (two) times daily.       No orders of the  defined types were placed in this encounter.    There is no immunization history on file for this patient.  Social History   Tobacco Use  . Smoking status: Never Smoker  . Smokeless tobacco: Never Used  Substance Use Topics  . Alcohol use: No    Frequency: Never    Family history is   Family History  Problem Relation Age of Onset  . Bowel Disease Daughter   . Stroke Son   . Other Son       Review of Systems  DATA OBTAINED: from patient, nurse GENERAL:  no fevers, fatigue, appetite changes SKIN: No itching, or rash EYES: No eye pain, redness, discharge EARS: No earache, tinnitus, change in hearing NOSE: No congestion, drainage or bleeding  MOUTH/THROAT: No mouth or tooth pain, No sore throat RESPIRATORY: No cough, wheezing, SOB CARDIAC: No chest pain, palpitations, lower extremity edema  GI: No abdominal pain, No N/V/D or constipation, No heartburn or reflux  GU: No dysuria, frequency or urgency, or incontinence  MUSCULOSKELETAL: No unrelieved bone/joint pain NEUROLOGIC: No headache, dizziness or focal weakness PSYCHIATRIC: No c/o anxiety or sadness   Vitals:   04/24/18 1346  BP: (!) 101/58  Pulse: 88  Resp: 17  Temp: 98.1 F (36.7 C)    SpO2 Readings from Last 1 Encounters:  03/30/18  93%   Body mass index is 17.43 kg/m.     Physical Exam  GENERAL APPEARANCE: Alert, conversant,  No acute distress.  SKIN: No diaphoresis rash HEAD: Normocephalic, atraumatic  EYES: Conjunctiva/lids clear. Pupils round, reactive. EOMs intact.  EARS: External exam WNL, canals clear. Hearing grossly normal.  NOSE: No deformity or discharge.  MOUTH/THROAT: Lips w/o lesions  RESPIRATORY: Breathing is even, unlabored. Lung sounds are clear   CARDIOVASCULAR: Heart RRR no murmurs, rubs or gallops. No peripheral edema.   GASTROINTESTINAL: Abdomen is soft, non-tender, not distended w/ normal bowel sounds. GENITOURINARY: Bladder non tender, not distended  MUSCULOSKELETAL: No  abnormal joints or musculature NEUROLOGIC:  Cranial nerves 2-12 grossly intact. Moves all extremities  PSYCHIATRIC: Mood and affect appropriate to situation, no behavioral issues  Patient Active Problem List   Diagnosis Date Noted  . Osteoporosis 03/01/2018  . Glaucoma 01/04/2018  . Dyspepsia 01/04/2018  . Hospice care patient 01/04/2018  . Palliative care by specialist   . Pressure injury of skin 12/18/2017  . Aspiration pneumonia (HCC) 12/16/2017  . Atrial fibrillation with RVR (HCC) 12/16/2017  . Dysphagia 12/16/2017  . Elevated random blood glucose level 12/16/2017  . Acute kidney injury superimposed on CKD (HCC) 12/16/2017  . Goals of care, counseling/discussion 12/16/2017  . Bronchiectasis (HCC) 12/16/2017  . Coronary atherosclerosis due to calcified coronary lesion of native artery 12/16/2017  . Cardiomegaly 12/16/2017  . Acute respiratory failure with hypoxemia (HCC) 12/16/2017  . A-fib (HCC) 12/16/2017      Labs reviewed: Basic Metabolic Panel:    Component Value Date/Time   NA 141 01/05/2018   K 4.2 01/05/2018   CL 111 12/19/2017 0236   CO2 19 (L) 12/19/2017 0236   GLUCOSE 153 (H) 12/19/2017 0236   BUN 12 01/05/2018   CREATININE 0.9 01/05/2018   CREATININE 0.95 12/19/2017 0236   CALCIUM 7.9 (L) 12/19/2017 0236   PROT 7.7 12/16/2017 1538   ALBUMIN 3.9 12/16/2017 1538   AST 85 (H) 12/16/2017 1538   ALT 32 12/16/2017 1538   ALKPHOS 93 12/16/2017 1538   BILITOT 1.1 12/16/2017 1538   GFRNONAA 50 (L) 12/19/2017 0236   GFRAA 58 (L) 12/19/2017 0236    Recent Labs    12/17/17 0249 12/18/17 0451 12/19/17 0236 01/05/18  NA 139 143 140 141  K 3.8 3.9 3.1* 4.2  CL 109 111 111  --   CO2 18* 22 19*  --   GLUCOSE 145* 95 153*  --   BUN 24* 23* 17 12  CREATININE 1.16* 1.16* 0.95 0.9  CALCIUM 8.4* 8.9 7.9*  --    Liver Function Tests: Recent Labs    12/16/17 1538  AST 85*  ALT 32  ALKPHOS 93  BILITOT 1.1  PROT 7.7  ALBUMIN 3.9   No results for  input(s): LIPASE, AMYLASE in the last 8760 hours. No results for input(s): AMMONIA in the last 8760 hours. CBC: Recent Labs    12/17/17 0249 12/18/17 0451 12/19/17 0236 01/05/18  WBC 6.0 6.9 7.1 5.8  HGB 11.3* 11.3* 10.4* 10.9*  HCT 32.8* 34.0* 31.3* 33*  MCV 88.9 92.1 91.8  --   PLT 196 201 192 258   Lipid No results for input(s): CHOL, HDL, LDLCALC, TRIG in the last 8760 hours.  Cardiac Enzymes: No results for input(s): CKTOTAL, CKMB, CKMBINDEX, TROPONINI in the last 8760 hours. BNP: No results for input(s): BNP in the last 8760 hours. No results found for: MICROALBUR No results found for: HGBA1C No results found  for: TSH No results found for: VITAMINB12 No results found for: FOLATE No results found for: IRON, TIBC, FERRITIN  Imaging and Procedures obtained prior to SNF admission: Dg Chest 2 View  Result Date: 12/16/2017 CLINICAL DATA:  Productive cough for 2 days EXAM: CHEST  2 VIEW COMPARISON:  None. FINDINGS: Cardiac shadow is at the upper limits of normal in size. Aortic calcifications are seen. The lungs are mildly hyperinflated. No focal infiltrate or sizable effusion is seen. No bony abnormality is noted. IMPRESSION: No active cardiopulmonary disease. Electronically Signed   By: Alcide Clever M.D.   On: 12/16/2017 13:22   Ct Head Wo Contrast  Result Date: 12/16/2017 CLINICAL DATA:  84 year old female found slumped over toilet. Appears weak. Initial encounter. EXAM: CT HEAD WITHOUT CONTRAST TECHNIQUE: Contiguous axial images were obtained from the base of the skull through the vertex without intravenous contrast. COMPARISON:  None. FINDINGS: Brain: No intracranial hemorrhage or CT evidence of large acute infarct. Mild chronic microvascular changes. Moderate global atrophy. No intracranial mass lesion noted on this unenhanced exam. Vascular: Vascular calcifications. Skull: Negative Sinuses/Orbits: No acute orbital abnormality right. Minimal right maxillary sinus and ethmoid  sinus air cells bilaterally. Other: Mastoid air cells and middle ear cavities are clear. IMPRESSION: No acute intracranial abnormality. Mild chronic microvascular changes. Moderate global atrophy. Electronically Signed   By: Lacy Duverney M.D.   On: 12/16/2017 13:48   Ct Chest Wo Contrast  Result Date: 12/16/2017 CLINICAL DATA:  Increased weakness. Patient aspirated with tachycardia. EXAM: CT CHEST WITHOUT CONTRAST TECHNIQUE: Multidetector CT imaging of the chest was performed following the standard protocol without IV contrast. COMPARISON:  12/16/2017 CXR FINDINGS: Cardiovascular: Atherosclerosis of the great vessels with normal branch pattern. Mild to moderate aortic atherosclerosis without aneurysm. Heart is mildly enlarged without pericardial effusion. There is left main and three-vessel coronary arteriosclerosis. Pulmonary vasculature is unremarkable for this unenhanced study. Mediastinum/Nodes: Normal thyroid gland without focal mass. Patent trachea and mainstem bronchi. Unremarkable CT appearance of the esophagus. No mediastinal or hilar lymphadenopathy given limitations of a noncontrast study. Lungs/Pleura: Patchy pneumonic consolidation and atelectasis is seen in the left lower lobe with faint ground-glass opacities in the left upper lobe and lingula. Minimal atelectasis is seen in the right lower lobe. There is mild bilateral bronchiectasis and bronchiolectasis. Upper Abdomen: Slight surface nodularity of the liver compatible with morphologic changes of cirrhosis. No space-occupying mass nor biliary dilatation. No acute abnormality in the upper abdomen. Musculoskeletal: No chest wall mass or suspicious bone lesions identified. IMPRESSION: 1. Left lower lobe pulmonary consolidations compatible with aspiration pneumonia given reported history. Lesser degree of faint ground-glass opacities are noted in the left upper lobe and lingula. 2. There is bilateral bronchiectasis and bronchiolectasis. 3.  Cardiomegaly with coronary arteriosclerosis. 4. Aortic atherosclerosis. Aortic Atherosclerosis (ICD10-I70.0). Electronically Signed   By: Tollie Eth M.D.   On: 12/16/2017 19:58   Dg Chest Portable 1 View  Result Date: 12/16/2017 CLINICAL DATA:  Generalized weakness. EXAM: PORTABLE CHEST 1 VIEW COMPARISON:  12/16/2017 FINDINGS: Patient rotated left. Mild cardiomegaly with transverse aortic atherosclerosis. No right and no definite left pleural effusion. No pneumothorax. Biapical pleural thickening. medial right lung base and possible developing left base airspace disease. IMPRESSION: Hyperinflation with development of right and possible left base airspace disease. Most likely atelectasis on this AP portable radiograph. Early pneumonia or aspiration could look similar. Cardiomegaly.  Aortic Atherosclerosis (ICD10-I70.0). Electronically Signed   By: Jeronimo Greaves M.D.   On: 12/16/2017 17:10   Dg  Swallowing Func-speech Pathology  Result Date: 12/17/2017 Objective Swallowing Evaluation: Type of Study: MBS-Modified Barium Swallow Study  Patient Details Name: CHERESE LOZANO MRN: [id number] Date of Birth: 1923-05-22 Today's Date: 12/17/2017 Time: SLP Start Time (ACUTE ONLY): 1443 -SLP Stop Time (ACUTE ONLY): 1455 SLP Time Calculation (min) (ACUTE ONLY): 12 min Past Medical History: Past Medical History: Diagnosis Date . Glaucoma 2019 . Pregnancy 1948  G4 P4004 Past Surgical History: Past Surgical History: Procedure Laterality Date . TONSILLECTOMY    as a child HPI: MARRIAH SANDERLIN is a 84 y.o. female with medical history of glaucoma who has not seen a primary physician in over 50 years. During her time in the emergency department the patient was offered something to drink and a pill and she seemed to be choking on it. She appeared to be having difficulty managing her secretions and there was concern she may have aspirated prior to admission. Chest CT (3/6) revealed left lower lobe pulmonary consolidations compatible with  aspiration pneumonia.  Subjective: Pt alert and cooperative to assessment Assessment / Plan / Recommendation CHL IP CLINICAL IMPRESSIONS 12/17/2017 Clinical Impression Pt presents with severe oropharyngeal dysphagia characterized by reduced laryngeal elevation/anterior mobility, base of tongue retraction, reduced epiglottic inversion, and weak lingual manipulation. Pt's epiglottis was noted to be smaller in size, leading to a smaller vallecular space and reduced deflection with thinner consistencies. Pt's smaller vallecula coupled with her reduced lingual propulsion led premature spillage to deflect off of full vallecula into laryngeal vestibule before and after the swallow. Pt used chin tuck strategy with Mod verbal/visual sues for thin liquid to contain premature spillage; however, strategy did not eliminate airway compromise. SLP provided max verbal cues to clear throat/cough to produce cough; however, her weak cough was minimally able to reduce penetrates. Given airway compromise with all consistencies tested, limited sensation, and reduced ability to protect airway, recommend NPO. Alternative means of nutrition potentially needed, depending on pt's GOC.  SLP Visit Diagnosis Dysphagia, oropharyngeal phase (R13.12) Attention and concentration deficit following -- Frontal lobe and executive function deficit following -- Impact on safety and function Severe aspiration risk   CHL IP TREATMENT RECOMMENDATION 12/17/2017 Treatment Recommendations Therapy as outlined in treatment plan below   Prognosis 12/17/2017 Prognosis for Safe Diet Advancement Fair Barriers to Reach Goals Severity of deficits Barriers/Prognosis Comment -- CHL IP DIET RECOMMENDATION 12/17/2017 SLP Diet Recommendations NPO;Alternative means - temporary Liquid Administration via -- Medication Administration Via alternative means Compensations -- Postural Changes --   CHL IP OTHER RECOMMENDATIONS 12/17/2017 Recommended Consults -- Oral Care Recommendations Oral  care QID Other Recommendations Have oral suction available   CHL IP FOLLOW UP RECOMMENDATIONS 12/17/2017 Follow up Recommendations Other (comment)   CHL IP FREQUENCY AND DURATION 12/17/2017 Speech Therapy Frequency (ACUTE ONLY) min 2x/week Treatment Duration 2 weeks      CHL IP ORAL PHASE 12/17/2017 Oral Phase Impaired Oral - Pudding Teaspoon -- Oral - Pudding Cup -- Oral - Honey Teaspoon Premature spillage;Weak lingual manipulation Oral - Honey Cup -- Oral - Nectar Teaspoon Premature spillage;Weak lingual manipulation Oral - Nectar Cup -- Oral - Nectar Straw -- Oral - Thin Teaspoon Premature spillage;Weak lingual manipulation Oral - Thin Cup Premature spillage;Weak lingual manipulation Oral - Thin Straw -- Oral - Puree Weak lingual manipulation;Premature spillage Oral - Mech Soft -- Oral - Regular -- Oral - Multi-Consistency -- Oral - Pill -- Oral Phase - Comment --  CHL IP PHARYNGEAL PHASE 12/17/2017 Pharyngeal Phase Impaired Pharyngeal- Pudding Teaspoon -- Pharyngeal -- Pharyngeal-  Pudding Cup -- Pharyngeal -- Pharyngeal- Honey Teaspoon Penetration/Aspiration before swallow;Pharyngeal residue - valleculae;Reduced anterior laryngeal mobility;Reduced laryngeal elevation;Reduced tongue base retraction;Compensatory strategies attempted (with notebox) Pharyngeal Material enters airway, passes BELOW cords without attempt by patient to eject out (silent aspiration) Pharyngeal- Honey Cup -- Pharyngeal -- Pharyngeal- Nectar Teaspoon Penetration/Aspiration before swallow;Pharyngeal residue - valleculae;Reduced laryngeal elevation;Reduced anterior laryngeal mobility;Reduced tongue base retraction Pharyngeal Material enters airway, passes BELOW cords without attempt by patient to eject out (silent aspiration) Pharyngeal- Nectar Cup -- Pharyngeal -- Pharyngeal- Nectar Straw -- Pharyngeal -- Pharyngeal- Thin Teaspoon Penetration/Apiration after swallow;Compensatory strategies attempted (with notebox);Reduced tongue base  retraction;Reduced laryngeal elevation;Pharyngeal residue - valleculae;Reduced anterior laryngeal mobility;Reduced epiglottic inversion Pharyngeal Material enters airway, passes BELOW cords without attempt by patient to eject out (silent aspiration) Pharyngeal- Thin Cup Penetration/Aspiration before swallow;Reduced laryngeal elevation;Reduced anterior laryngeal mobility;Reduced tongue base retraction;Pharyngeal residue - valleculae Pharyngeal Material enters airway, passes BELOW cords and not ejected out despite cough attempt by patient Pharyngeal- Thin Straw -- Pharyngeal -- Pharyngeal- Puree Penetration/Aspiration before swallow;Reduced tongue base retraction;Reduced laryngeal elevation;Reduced anterior laryngeal mobility;Pharyngeal residue - valleculae Pharyngeal Material enters airway, passes BELOW cords without attempt by patient to eject out (silent aspiration) Pharyngeal- Mechanical Soft -- Pharyngeal -- Pharyngeal- Regular -- Pharyngeal -- Pharyngeal- Multi-consistency -- Pharyngeal -- Pharyngeal- Pill -- Pharyngeal -- Pharyngeal Comment --  CHL IP CERVICAL ESOPHAGEAL PHASE 12/17/2017 Cervical Esophageal Phase WFL Pudding Teaspoon -- Pudding Cup -- Honey Teaspoon -- Honey Cup -- Nectar Teaspoon -- Nectar Cup -- Nectar Straw -- Thin Teaspoon -- Thin Cup -- Thin Straw -- Puree -- Mechanical Soft -- Regular -- Multi-consistency -- Pill -- Cervical Esophageal Comment -- No flowsheet data found. Maxcine Hamaiewonsky, Laura 12/17/2017, 5:31 PM  Maxcine HamLaura Paiewonsky, M.A. CCC-SLP [phone]               Not all labs, radiology exams or other studies done during hospitalization come through on my EPIC note; however they are reviewed by me.    Assessment and Plan  A-fib Operating Room Services(HCC) Patient on no meds for rate or prophylaxis secondary to being hospice into the fact the patient is ready to die; monitor  Glaucoma Continues all glaucoma medications including Zilactin 0.005% 1 drop nightly, timolol 0.5% 1 drop twice daily and  Alphagan 0.5% 1 drop twice daily  Coronary atherosclerosis due to calcified coronary lesion of native artery Patient is off all medications secondary to hospice status and to the fact the patient is ready to go spiritually mentally and emotionally; monitor   Thurston Holenne D. Natalie HollingsheadAlexander, MD

## 2018-04-24 NOTE — Assessment & Plan Note (Signed)
Stable; continue Tums 500 mg every 4 hours as needed for heartburn

## 2018-05-03 ENCOUNTER — Encounter: Payer: Self-pay | Admitting: Internal Medicine

## 2018-05-03 NOTE — Assessment & Plan Note (Signed)
Continues all glaucoma medications including Zilactin 0.005% 1 drop nightly, timolol 0.5% 1 drop twice daily and Alphagan 0.5% 1 drop twice daily

## 2018-05-03 NOTE — Assessment & Plan Note (Signed)
Patient is off all medications secondary to hospice status and to the fact the patient is ready to go spiritually mentally and emotionally; monitor

## 2018-05-03 NOTE — Assessment & Plan Note (Signed)
Patient on no meds for rate or prophylaxis secondary to being hospice into the fact the patient is ready to die; monitor

## 2018-06-01 ENCOUNTER — Non-Acute Institutional Stay (SKILLED_NURSING_FACILITY): Payer: Medicare Other | Admitting: Internal Medicine

## 2018-06-01 ENCOUNTER — Encounter: Payer: Self-pay | Admitting: Internal Medicine

## 2018-06-01 DIAGNOSIS — R1013 Epigastric pain: Secondary | ICD-10-CM

## 2018-06-01 DIAGNOSIS — H409 Unspecified glaucoma: Secondary | ICD-10-CM | POA: Diagnosis not present

## 2018-06-01 DIAGNOSIS — I4891 Unspecified atrial fibrillation: Secondary | ICD-10-CM | POA: Diagnosis not present

## 2018-06-01 NOTE — Progress Notes (Signed)
[facility] Room Number: [phone] Place of Service:  SNF [phone]  Randon Goldsmithnne D. Lyn HollingsheadAlexander, MD  Patient Care Team: Margit HanksAlexander, Brizeyda Holtmeyer D, MD as PCP - General (Internal Medicine)  Extended Emergency Contact Information Primary Emergency Contact: Levy PupaWade, Guy Mobile Phone: [phone] Relation: Son    Allergies: Patient has no known allergies.  Chief Complaint  Patient presents with  . Medical Management of Chronic Issues    Routine Visit    HPI: Patient is 84 y.o. female who is being seen for routine issues of dyspepsia, atrial fibrillation, and glaucoma.  Past Medical History:  Diagnosis Date  . A-fib (HCC) 12/16/2017  . Aspiration pneumonia (HCC) 12/16/2017  . Dysphagia   . Glaucoma 2019  . Pregnancy 1950   G4 P4004    Past Surgical History:  Procedure Laterality Date  . TONSILLECTOMY     as a child    Allergies as of 06/01/2018   No Known Allergies     Medication List        Accurate as of 06/01/18 11:59 PM. Always use your most recent med list.          acetaminophen 325 MG tablet Commonly known as:  TYLENOL Take 650 mg by mouth every 6 (six) hours as needed.   calcium carbonate 500 MG chewable tablet Commonly known as:  TUMS - dosed in mg elemental calcium Chew 2 tablets by mouth every 4 (four) hours as needed for indigestion or heartburn.   COMBIGAN 0.2-0.5 % ophthalmic solution Generic drug:  brimonidine-timolol Place 1 drop into both eyes every 6 (six) hours.   dextromethorphan-guaiFENesin 10-100 MG/5ML liquid Commonly known as:  ROBITUSSIN-DM Take 15 mLs by mouth every 6 (six) hours as needed for cough.   latanoprost 0.005 % ophthalmic solution Commonly known as:  XALATAN Place 1 drop into both eyes at bedtime.   nystatin powder Generic drug:  nystatin Apply topically. apply to back QD       No orders of the defined types were placed in this encounter.    There is no immunization history on file for  this patient.  Social History   Tobacco Use  . Smoking status: Never Smoker  . Smokeless tobacco: Never Used  Substance Use Topics  . Alcohol use: No    Frequency: Never    Review of Systems  DATA OBTAINED: from patient, nurse GENERAL:  no fevers, fatigue, appetite changes SKIN: No itching, rash HEENT: No complaint RESPIRATORY: No cough, wheezing, SOB CARDIAC: No chest pain, palpitations, lower extremity edema  GI: No abdominal pain, No N/V/D or constipation, No heartburn or reflux  GU: No dysuria, frequency or urgency, or incontinence  MUSCULOSKELETAL: No unrelieved bone/joint pain NEUROLOGIC: No headache, dizziness  PSYCHIATRIC: No overt anxiety or sadness  Vitals:   06/01/18 1500  BP: 101/61  Pulse: 88  Resp: 17  Temp: 98.2 F (36.8 C)   Body mass index is 16.95 kg/m. Physical Exam  GENERAL APPEARANCE: Alert, conversant, No acute distress  SKIN: No diaphoresis rash HEENT: Unremarkable RESPIRATORY: Breathing is even, unlabored. Lung sounds are clear   CARDIOVASCULAR: Heart RRR no murmurs, rubs or gallops. No peripheral edema  GASTROINTESTINAL: Abdomen is soft, non-tender, not distended w/ normal bowel sounds.  GENITOURINARY: Bladder non tender, not distended  MUSCULOSKELETAL: No abnormal joints or musculature NEUROLOGIC: Cranial nerves 2-12 grossly intact. Moves all extremities PSYCHIATRIC: Mood and affect appropriate to situation, no behavioral issues  Patient Active Problem List   Diagnosis  Date Noted  . Osteoporosis 03/01/2018  . Glaucoma 01/04/2018  . Dyspepsia 01/04/2018  . Hospice care patient 01/04/2018  . Palliative care by specialist   . Pressure injury of skin 12/18/2017  . Aspiration pneumonia (HCC) 12/16/2017  . Atrial fibrillation with RVR (HCC) 12/16/2017  . Dysphagia 12/16/2017  . Elevated random blood glucose level 12/16/2017  . Acute kidney injury superimposed on CKD (HCC) 12/16/2017  . Goals of care, counseling/discussion 12/16/2017    . Bronchiectasis (HCC) 12/16/2017  . Coronary atherosclerosis due to calcified coronary lesion of native artery 12/16/2017  . Cardiomegaly 12/16/2017  . Acute respiratory failure with hypoxemia (HCC) 12/16/2017  . A-fib (HCC) 12/16/2017    CMP     Component Value Date/Time   NA 141 01/05/2018   K 4.2 01/05/2018   CL 111 12/19/2017 0236   CO2 19 (L) 12/19/2017 0236   GLUCOSE 153 (H) 12/19/2017 0236   BUN 12 01/05/2018   CREATININE 0.9 01/05/2018   CREATININE 0.95 12/19/2017 0236   CALCIUM 7.9 (L) 12/19/2017 0236   PROT 7.7 12/16/2017 1538   ALBUMIN 3.9 12/16/2017 1538   AST 85 (H) 12/16/2017 1538   ALT 32 12/16/2017 1538   ALKPHOS 93 12/16/2017 1538   BILITOT 1.1 12/16/2017 1538   GFRNONAA 50 (L) 12/19/2017 0236   GFRAA 58 (L) 12/19/2017 0236   Recent Labs    12/17/17 0249 12/18/17 0451 12/19/17 0236 01/05/18  NA 139 143 140 141  K 3.8 3.9 3.1* 4.2  CL 109 111 111  --   CO2 18* 22 19*  --   GLUCOSE 145* 95 153*  --   BUN 24* 23* 17 12  CREATININE 1.16* 1.16* 0.95 0.9  CALCIUM 8.4* 8.9 7.9*  --    Recent Labs    12/16/17 1538  AST 85*  ALT 32  ALKPHOS 93  BILITOT 1.1  PROT 7.7  ALBUMIN 3.9   Recent Labs    12/17/17 0249 12/18/17 0451 12/19/17 0236 01/05/18  WBC 6.0 6.9 7.1 5.8  HGB 11.3* 11.3* 10.4* 10.9*  HCT 32.8* 34.0* 31.3* 33*  MCV 88.9 92.1 91.8  --   PLT 196 201 192 258   No results for input(s): CHOL, LDLCALC, TRIG in the last 8760 hours.  Invalid input(s): HCL No results found for: MICROALBUR No results found for: TSH No results found for: HGBA1C No results found for: CHOL, HDL, LDLCALC, LDLDIRECT, TRIG, CHOLHDL  Significant Diagnostic Results in last 30 days:  No results found.  Assessment and Plan  Dyspepsia Chronic and stable; continue calcium carbonate 500 mg to chews every 4 as needed  A-fib [facility](HCC) Patient on no rate or prophylaxis secondary to hospice status; monitor  Glaucoma Continues glaucoma medications, this is  comfort care; continue xalatan 0.005% 1 drop nightly, and Combigan 0.2-0.51 drop every 6    Kasim Mccorkle D. Lyn HollingsheadAlexander, MD

## 2018-06-06 ENCOUNTER — Encounter: Payer: Self-pay | Admitting: Internal Medicine

## 2018-06-06 NOTE — Assessment & Plan Note (Signed)
Patient on no rate or prophylaxis secondary to hospice status; monitor

## 2018-06-06 NOTE — Assessment & Plan Note (Signed)
Chronic and stable; continue calcium carbonate 500 mg to chews every 4 as needed

## 2018-06-06 NOTE — Assessment & Plan Note (Signed)
Continues glaucoma medications, this is comfort care; continue xalatan 0.005% 1 drop nightly, and Combigan 0.2-0.51 drop every 6

## 2018-06-26 ENCOUNTER — Encounter: Payer: Self-pay | Admitting: Internal Medicine

## 2018-06-26 ENCOUNTER — Non-Acute Institutional Stay (SKILLED_NURSING_FACILITY): Payer: Medicare Other | Admitting: Internal Medicine

## 2018-06-26 DIAGNOSIS — R000 Tachycardia, unspecified: Secondary | ICD-10-CM | POA: Diagnosis not present

## 2018-06-26 DIAGNOSIS — R0902 Hypoxemia: Secondary | ICD-10-CM | POA: Diagnosis not present

## 2018-06-26 NOTE — Progress Notes (Signed)
:    [facility] Room Number: [phone] Place of Service:  SNF (31)  Randon Goldsmith. [name], MD  Patient Care Team: Margit Hanks, MD as PCP - General (Internal Medicine)  Extended Emergency Contact Information Primary Emergency Contact: Levy Pupa Mobile Phone: [phone] Relation: Son     Allergies: Patient has no known allergies.  Chief Complaint  Patient presents with  . Acute Visit    Abnormal vital signs    HPI: Patient is 84 y.o. female who I been asked to see acutely because patient on her routine vital signs was found to have a pulse of 124 and an O2 saturation of 73% on room air.  Patient denies chest pain shortness of breath weakness lightheadedness dizziness or any other systemic symptom.  Patient does not want to go to the hospital for any reason.  Past Medical History:  Diagnosis Date  . A-fib (HCC) 12/16/2017  . Aspiration pneumonia (HCC) 12/16/2017  . Dysphagia   . Glaucoma 2019  . Pregnancy 1950   G4 P4004    Past Surgical History:  Procedure Laterality Date  . TONSILLECTOMY     as a child    Allergies as of 06/26/2018   No Known Allergies     Medication List        Accurate as of 06/26/18  4:12 PM. Always use your most recent med list.          acetaminophen 325 MG tablet Commonly known as:  TYLENOL Take 650 mg by mouth every 6 (six) hours as needed.   calcium carbonate 500 MG chewable tablet Commonly known as:  TUMS - dosed in mg elemental calcium Chew 2 tablets by mouth every 4 (four) hours as needed for indigestion or heartburn.   COMBIGAN 0.2-0.5 % ophthalmic solution Generic drug:  brimonidine-timolol Place 1 drop into both eyes every 6 (six) hours.   dextromethorphan-guaiFENesin 10-100 MG/5ML liquid Commonly known as:  ROBITUSSIN-DM Take 15 mLs by mouth every 6 (six) hours as needed for cough.   latanoprost 0.005 % ophthalmic solution Commonly known as:  XALATAN Place 1 drop into both  eyes at bedtime.   nystatin powder Generic drug:  nystatin Apply topically. apply to back QD       No orders of the defined types were placed in this encounter.    There is no immunization history on file for this patient.  Social History   Tobacco Use  . Smoking status: Never Smoker  . Smokeless tobacco: Never Used  Substance Use Topics  . Alcohol use: No    Frequency: Never    Family history is   Family History  Problem Relation Age of Onset  . Bowel Disease Daughter   . Stroke Son   . Other Son       Review of Systems  DATA OBTAINED: from patient, nurse GENERAL:  no fevers, fatigue, appetite changes SKIN: No itching, or rash EYES: No eye pain, redness, discharge EARS: No earache, tinnitus, change in hearing NOSE: No congestion, drainage or bleeding  MOUTH/THROAT: No mouth or tooth pain, No sore throat RESPIRATORY: No cough, wheezing, SOB CARDIAC: No chest pain, palpitations, lower extremity edema  GI: No abdominal pain, No N/V/D or constipation, No heartburn or reflux  GU: No dysuria, frequency or urgency, or incontinence  MUSCULOSKELETAL: No unrelieved bone/joint pain NEUROLOGIC: No headache, dizziness or focal weakness PSYCHIATRIC: No c/o anxiety or sadness   Vitals:   06/26/18 1609  BP: (!) 87/50  Pulse: (!) 101  Resp: 17  Temp: 98 F (36.7 C)  SpO2: 91%    SpO2 Readings from Last 1 Encounters:  06/26/18 91%   Body mass index is 16.95 kg/m.     Physical Exam  GENERAL APPEARANCE: Alert, conversant,  No acute distress.  SKIN: No diaphoresis rash HEAD: Normocephalic, atraumatic  EYES: Conjunctiva/lids clear. Pupils round, reactive. EOMs intact.  EARS: External exam WNL, canals clear. Hearing grossly normal.  NOSE: No deformity or discharge.  MOUTH/THROAT: Lips w/o lesions  RESPIRATORY: Breathing is even, mildly labored. Lung sounds are clear   CARDIOVASCULAR: Heart regular, rapid, 4/6 systolic murmurs, no rubs or gallops. No  peripheral edema.   GASTROINTESTINAL: Abdomen is soft, non-tender, not distended w/ normal bowel sounds. GENITOURINARY: Bladder non tender, not distended  MUSCULOSKELETAL: No abnormal joints or musculature NEUROLOGIC:  Cranial nerves 2-12 grossly intact. Moves all extremities  PSYCHIATRIC: Mood and affect appropriate to situation, no behavioral issues  Patient Active Problem List   Diagnosis Date Noted  . Osteoporosis 03/01/2018  . Glaucoma 01/04/2018  . Dyspepsia 01/04/2018  . Hospice care patient 01/04/2018  . Palliative care by specialist   . Pressure injury of skin 12/18/2017  . Aspiration pneumonia (HCC) 12/16/2017  . Atrial fibrillation with RVR (HCC) 12/16/2017  . Dysphagia 12/16/2017  . Elevated random blood glucose level 12/16/2017  . Acute kidney injury superimposed on CKD (HCC) 12/16/2017  . Goals of care, counseling/discussion 12/16/2017  . Bronchiectasis (HCC) 12/16/2017  . Coronary atherosclerosis due to calcified coronary lesion of native artery 12/16/2017  . Cardiomegaly 12/16/2017  . Acute respiratory failure with hypoxemia (HCC) 12/16/2017  . A-fib (HCC) 12/16/2017      Labs reviewed: Basic Metabolic Panel:    Component Value Date/Time   NA 141 01/05/2018   K 4.2 01/05/2018   CL 111 12/19/2017 0236   CO2 19 (L) 12/19/2017 0236   GLUCOSE 153 (H) 12/19/2017 0236   BUN 12 01/05/2018   CREATININE 0.9 01/05/2018   CREATININE 0.95 12/19/2017 0236   CALCIUM 7.9 (L) 12/19/2017 0236   PROT 7.7 12/16/2017 1538   ALBUMIN 3.9 12/16/2017 1538   AST 85 (H) 12/16/2017 1538   ALT 32 12/16/2017 1538   ALKPHOS 93 12/16/2017 1538   BILITOT 1.1 12/16/2017 1538   GFRNONAA 50 (L) 12/19/2017 0236   GFRAA 58 (L) 12/19/2017 0236    Recent Labs    12/17/17 0249 12/18/17 0451 12/19/17 0236 01/05/18  NA 139 143 140 141  K 3.8 3.9 3.1* 4.2  CL 109 111 111  --   CO2 18* 22 19*  --   GLUCOSE 145* 95 153*  --   BUN 24* 23* 17 12  CREATININE 1.16* 1.16* 0.95 0.9    CALCIUM 8.4* 8.9 7.9*  --    Liver Function Tests: Recent Labs    12/16/17 1538  AST 85*  ALT 32  ALKPHOS 93  BILITOT 1.1  PROT 7.7  ALBUMIN 3.9   No results for input(s): LIPASE, AMYLASE in the last 8760 hours. No results for input(s): AMMONIA in the last 8760 hours. CBC: Recent Labs    12/17/17 0249 12/18/17 0451 12/19/17 0236 01/05/18  WBC 6.0 6.9 7.1 5.8  HGB 11.3* 11.3* 10.4* 10.9*  HCT 32.8* 34.0* 31.3* 33*  MCV 88.9 92.1 91.8  --   PLT 196 201 192 258   Lipid No results for input(s): CHOL, HDL, LDLCALC, TRIG in the last 8760 hours.  Cardiac Enzymes: No results  for input(s): CKTOTAL, CKMB, CKMBINDEX, TROPONINI in the last 8760 hours. BNP: No results for input(s): BNP in the last 8760 hours. No results found for: MICROALBUR No results found for: HGBA1C No results found for: TSH No results found for: VITAMINB12 No results found for: FOLATE No results found for: IRON, TIBC, FERRITIN  Imaging and Procedures obtained prior to SNF admission: Dg Chest 2 View  Result Date: 12/16/2017 CLINICAL DATA:  Productive cough for 2 days EXAM: CHEST  2 VIEW COMPARISON:  None. FINDINGS: Cardiac shadow is at the upper limits of normal in size. Aortic calcifications are seen. The lungs are mildly hyperinflated. No focal infiltrate or sizable effusion is seen. No bony abnormality is noted. IMPRESSION: No active cardiopulmonary disease. Electronically Signed   By: Alcide Clever M.D.   On: 12/16/2017 13:22   Ct Head Wo Contrast  Result Date: 12/16/2017 CLINICAL DATA:  84 year old female found slumped over toilet. Appears weak. Initial encounter. EXAM: CT HEAD WITHOUT CONTRAST TECHNIQUE: Contiguous axial images were obtained from the base of the skull through the vertex without intravenous contrast. COMPARISON:  None. FINDINGS: Brain: No intracranial hemorrhage or CT evidence of large acute infarct. Mild chronic microvascular changes. Moderate global atrophy. No intracranial mass lesion  noted on this unenhanced exam. Vascular: Vascular calcifications. Skull: Negative Sinuses/Orbits: No acute orbital abnormality right. Minimal right maxillary sinus and ethmoid sinus air cells bilaterally. Other: Mastoid air cells and middle ear cavities are clear. IMPRESSION: No acute intracranial abnormality. Mild chronic microvascular changes. Moderate global atrophy. Electronically Signed   By: Lacy Duverney M.D.   On: 12/16/2017 13:48   Ct Chest Wo Contrast  Result Date: 12/16/2017 CLINICAL DATA:  Increased weakness. Patient aspirated with tachycardia. EXAM: CT CHEST WITHOUT CONTRAST TECHNIQUE: Multidetector CT imaging of the chest was performed following the standard protocol without IV contrast. COMPARISON:  12/16/2017 CXR FINDINGS: Cardiovascular: Atherosclerosis of the great vessels with normal branch pattern. Mild to moderate aortic atherosclerosis without aneurysm. Heart is mildly enlarged without pericardial effusion. There is left main and three-vessel coronary arteriosclerosis. Pulmonary vasculature is unremarkable for this unenhanced study. Mediastinum/Nodes: Normal thyroid gland without focal mass. Patent trachea and mainstem bronchi. Unremarkable CT appearance of the esophagus. No mediastinal or hilar lymphadenopathy given limitations of a noncontrast study. Lungs/Pleura: Patchy pneumonic consolidation and atelectasis is seen in the left lower lobe with faint ground-glass opacities in the left upper lobe and lingula. Minimal atelectasis is seen in the right lower lobe. There is mild bilateral bronchiectasis and bronchiolectasis. Upper Abdomen: Slight surface nodularity of the liver compatible with morphologic changes of cirrhosis. No space-occupying mass nor biliary dilatation. No acute abnormality in the upper abdomen. Musculoskeletal: No chest wall mass or suspicious bone lesions identified. IMPRESSION: 1. Left lower lobe pulmonary consolidations compatible with aspiration pneumonia given  reported history. Lesser degree of faint ground-glass opacities are noted in the left upper lobe and lingula. 2. There is bilateral bronchiectasis and bronchiolectasis. 3. Cardiomegaly with coronary arteriosclerosis. 4. Aortic atherosclerosis. Aortic Atherosclerosis (ICD10-I70.0). Electronically Signed   By: Tollie Eth M.D.   On: 12/16/2017 19:58   Dg Chest Portable 1 View  Result Date: 12/16/2017 CLINICAL DATA:  Generalized weakness. EXAM: PORTABLE CHEST 1 VIEW COMPARISON:  12/16/2017 FINDINGS: Patient rotated left. Mild cardiomegaly with transverse aortic atherosclerosis. No right and no definite left pleural effusion. No pneumothorax. Biapical pleural thickening. medial right lung base and possible developing left base airspace disease. IMPRESSION: Hyperinflation with development of right and possible left base airspace disease. Most likely  atelectasis on this AP portable radiograph. Early pneumonia or aspiration could look similar. Cardiomegaly.  Aortic Atherosclerosis (ICD10-I70.0). Electronically Signed   By: Jeronimo Greaves M.D.   On: 12/16/2017 17:10   Dg Swallowing Func-speech Pathology  Result Date: 12/17/2017 Objective Swallowing Evaluation: Type of Study: MBS-Modified Barium Swallow Study  Patient Details Name: BRYNLIE DAZA MRN: [id number] Date of Birth: 08-Jul-1923 Today's Date: 12/17/2017 Time: SLP Start Time (ACUTE ONLY): 1443 -SLP Stop Time (ACUTE ONLY): 1455 SLP Time Calculation (min) (ACUTE ONLY): 12 min Past Medical History: Past Medical History: Diagnosis Date . Glaucoma 2019 . Pregnancy 1948  G4 P4004 Past Surgical History: Past Surgical History: Procedure Laterality Date . TONSILLECTOMY    as a child HPI: NIMSI MALES is a 84 y.o. female with medical history of glaucoma who has not seen a primary physician in over 50 years. During her time in the emergency department the patient was offered something to drink and a pill and she seemed to be choking on it. She appeared to be having difficulty  managing her secretions and there was concern she may have aspirated prior to admission. Chest CT (3/6) revealed left lower lobe pulmonary consolidations compatible with aspiration pneumonia.  Subjective: Pt alert and cooperative to assessment Assessment / Plan / Recommendation CHL IP CLINICAL IMPRESSIONS 12/17/2017 Clinical Impression Pt presents with severe oropharyngeal dysphagia characterized by reduced laryngeal elevation/anterior mobility, base of tongue retraction, reduced epiglottic inversion, and weak lingual manipulation. Pt's epiglottis was noted to be smaller in size, leading to a smaller vallecular space and reduced deflection with thinner consistencies. Pt's smaller vallecula coupled with her reduced lingual propulsion led premature spillage to deflect off of full vallecula into laryngeal vestibule before and after the swallow. Pt used chin tuck strategy with Mod verbal/visual sues for thin liquid to contain premature spillage; however, strategy did not eliminate airway compromise. SLP provided max verbal cues to clear throat/cough to produce cough; however, her weak cough was minimally able to reduce penetrates. Given airway compromise with all consistencies tested, limited sensation, and reduced ability to protect airway, recommend NPO. Alternative means of nutrition potentially needed, depending on pt's GOC.  SLP Visit Diagnosis Dysphagia, oropharyngeal phase (R13.12) Attention and concentration deficit following -- Frontal lobe and executive function deficit following -- Impact on safety and function Severe aspiration risk   CHL IP TREATMENT RECOMMENDATION 12/17/2017 Treatment Recommendations Therapy as outlined in treatment plan below   Prognosis 12/17/2017 Prognosis for Safe Diet Advancement Fair Barriers to Reach Goals Severity of deficits Barriers/Prognosis Comment -- CHL IP DIET RECOMMENDATION 12/17/2017 SLP Diet Recommendations NPO;Alternative means - temporary Liquid Administration via -- Medication  Administration Via alternative means Compensations -- Postural Changes --   CHL IP OTHER RECOMMENDATIONS 12/17/2017 Recommended Consults -- Oral Care Recommendations Oral care QID Other Recommendations Have oral suction available   CHL IP FOLLOW UP RECOMMENDATIONS 12/17/2017 Follow up Recommendations Other (comment)   CHL IP FREQUENCY AND DURATION 12/17/2017 Speech Therapy Frequency (ACUTE ONLY) min 2x/week Treatment Duration 2 weeks      CHL IP ORAL PHASE 12/17/2017 Oral Phase Impaired Oral - Pudding Teaspoon -- Oral - Pudding Cup -- Oral - Honey Teaspoon Premature spillage;Weak lingual manipulation Oral - Honey Cup -- Oral - Nectar Teaspoon Premature spillage;Weak lingual manipulation Oral - Nectar Cup -- Oral - Nectar Straw -- Oral - Thin Teaspoon Premature spillage;Weak lingual manipulation Oral - Thin Cup Premature spillage;Weak lingual manipulation Oral - Thin Straw -- Oral - Puree Weak lingual manipulation;Premature spillage Oral - Mech  Soft -- Oral - Regular -- Oral - Multi-Consistency -- Oral - Pill -- Oral Phase - Comment --  CHL IP PHARYNGEAL PHASE 12/17/2017 Pharyngeal Phase Impaired Pharyngeal- Pudding Teaspoon -- Pharyngeal -- Pharyngeal- Pudding Cup -- Pharyngeal -- Pharyngeal- Honey Teaspoon Penetration/Aspiration before swallow;Pharyngeal residue - valleculae;Reduced anterior laryngeal mobility;Reduced laryngeal elevation;Reduced tongue base retraction;Compensatory strategies attempted (with notebox) Pharyngeal Material enters airway, passes BELOW cords without attempt by patient to eject out (silent aspiration) Pharyngeal- Honey Cup -- Pharyngeal -- Pharyngeal- Nectar Teaspoon Penetration/Aspiration before swallow;Pharyngeal residue - valleculae;Reduced laryngeal elevation;Reduced anterior laryngeal mobility;Reduced tongue base retraction Pharyngeal Material enters airway, passes BELOW cords without attempt by patient to eject out (silent aspiration) Pharyngeal- Nectar Cup -- Pharyngeal -- Pharyngeal- Nectar  Straw -- Pharyngeal -- Pharyngeal- Thin Teaspoon Penetration/Apiration after swallow;Compensatory strategies attempted (with notebox);Reduced tongue base retraction;Reduced laryngeal elevation;Pharyngeal residue - valleculae;Reduced anterior laryngeal mobility;Reduced epiglottic inversion Pharyngeal Material enters airway, passes BELOW cords without attempt by patient to eject out (silent aspiration) Pharyngeal- Thin Cup Penetration/Aspiration before swallow;Reduced laryngeal elevation;Reduced anterior laryngeal mobility;Reduced tongue base retraction;Pharyngeal residue - valleculae Pharyngeal Material enters airway, passes BELOW cords and not ejected out despite cough attempt by patient Pharyngeal- Thin Straw -- Pharyngeal -- Pharyngeal- Puree Penetration/Aspiration before swallow;Reduced tongue base retraction;Reduced laryngeal elevation;Reduced anterior laryngeal mobility;Pharyngeal residue - valleculae Pharyngeal Material enters airway, passes BELOW cords without attempt by patient to eject out (silent aspiration) Pharyngeal- Mechanical Soft -- Pharyngeal -- Pharyngeal- Regular -- Pharyngeal -- Pharyngeal- Multi-consistency -- Pharyngeal -- Pharyngeal- Pill -- Pharyngeal -- Pharyngeal Comment --  CHL IP CERVICAL ESOPHAGEAL PHASE 12/17/2017 Cervical Esophageal Phase WFL Pudding Teaspoon -- Pudding Cup -- Honey Teaspoon -- Honey Cup -- Nectar Teaspoon -- Nectar Cup -- Nectar Straw -- Thin Teaspoon -- Thin Cup -- Thin Straw -- Puree -- Mechanical Soft -- Regular -- Multi-consistency -- Pill -- Cervical Esophageal Comment -- No flowsheet data found. Maxcine Hamaiewonsky, Laura 12/17/2017, 5:31 PM  Maxcine HamLaura Paiewonsky, M.A. CCC-SLP [phone]               Not all labs, radiology exams or other studies done during hospitalization come through on my EPIC note; however they are reviewed by me.    Assessment and Plan  Hypoxia/tachycardia- patient with history of atrial fib; patient is either sinus tach or slow atrial  flutter; will start a very low dose Coreg 3.125 mg twice daily and of course O2, 2 L or enough to keep patient's age saturation greater than 90; will monitor response    Thurston HoleAnne D. Lyn HollingsheadAlexander, MD

## 2018-07-05 ENCOUNTER — Encounter: Payer: Self-pay | Admitting: Internal Medicine

## 2018-07-14 DEATH — deceased

## 2018-09-16 IMAGING — CT CT CHEST W/O CM
2 of 4 series · 15 of 36 positions shown, 18 images · non-contrast
Comparison: 12/16/2017 CXR

CLINICAL DATA: Increased weakness. Patient aspirated with
tachycardia.

EXAM:
CT CHEST WITHOUT CONTRAST
TECHNIQUE: Multidetector CT imaging of the chest was performed following the
standard protocol without IV contrast.

[Series 3: chest wo · axial · 0.67mm/px · z∈[+1064,+1326]mm · 12 of 155 slices shown, 15 images]
[im 12/155  mediastinal]
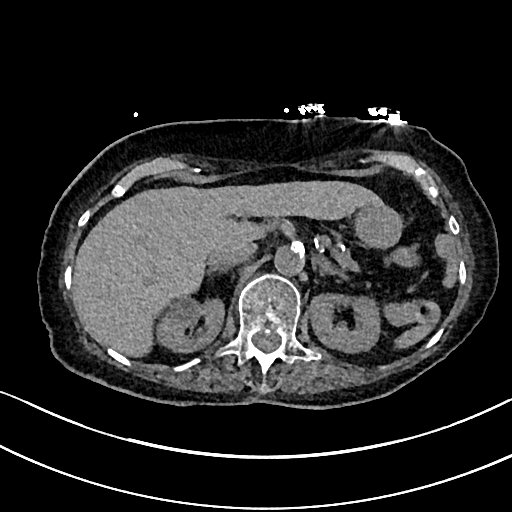
[im 12/155  lung]
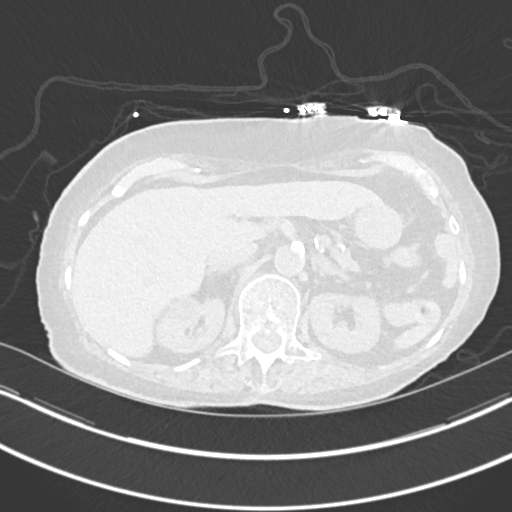
[im 24/155  lung]
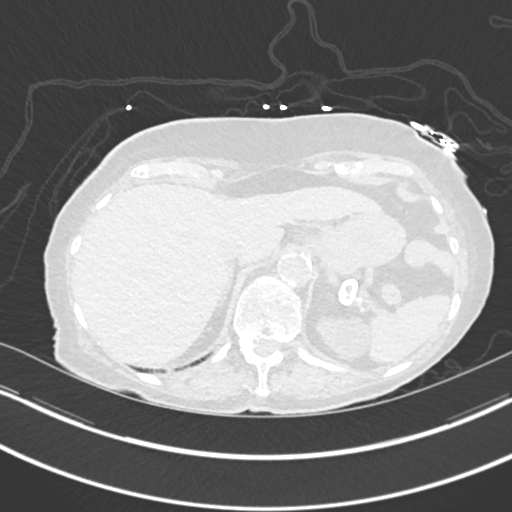
[im 36/155  lung]
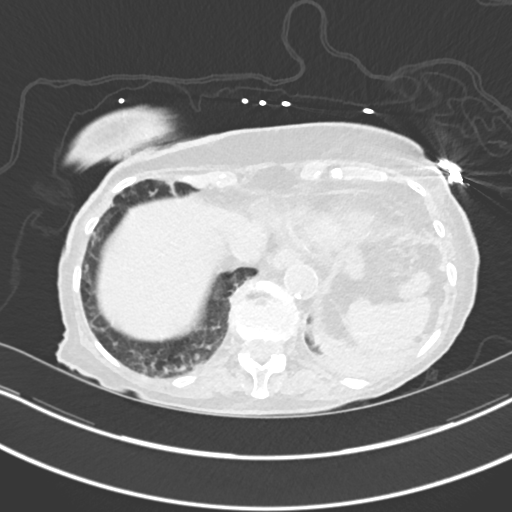
[im 48/155  lung]
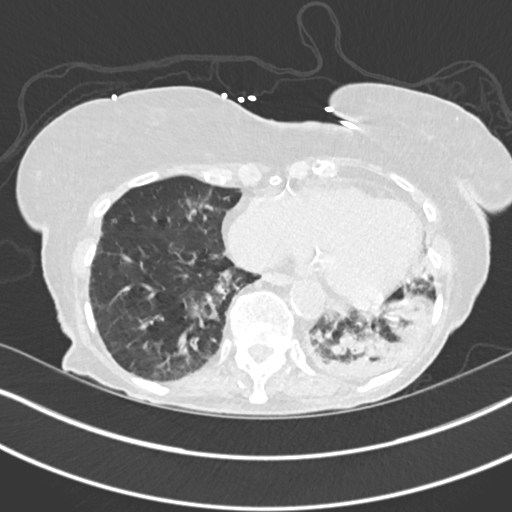
[im 60/155  mediastinal]
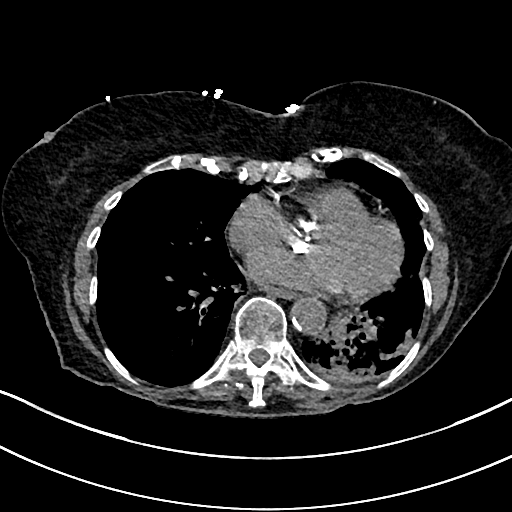
[im 60/155  lung]
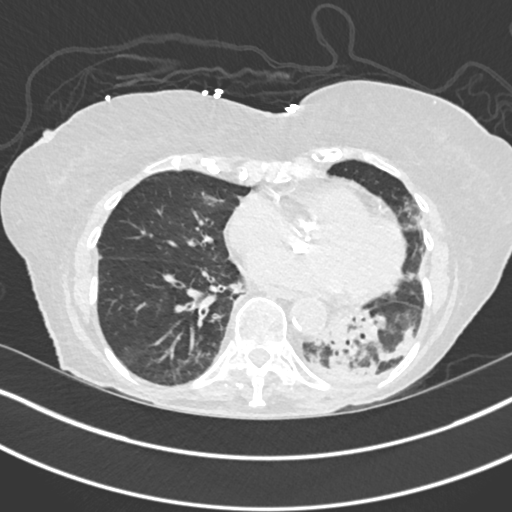
[im 72/155  lung]
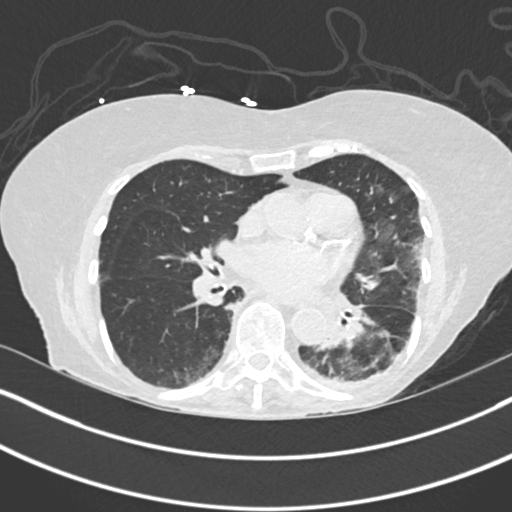
[im 83/155  lung]
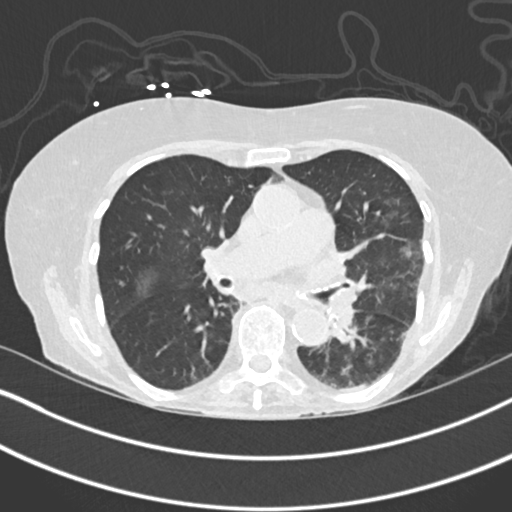
[im 95/155  lung]
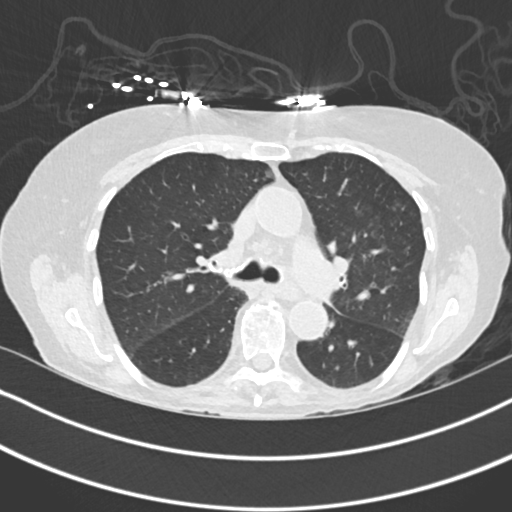
[im 107/155  mediastinal]
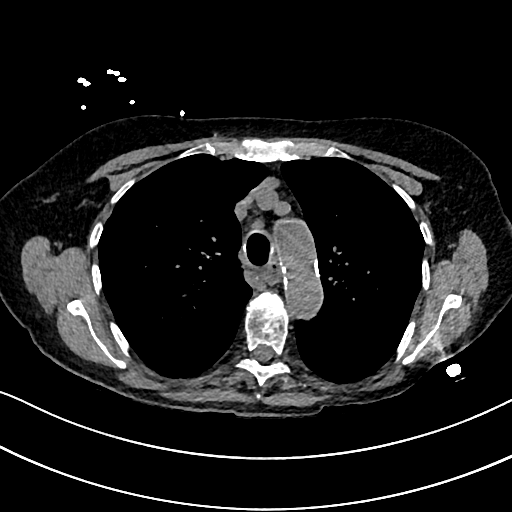
[im 107/155  lung]
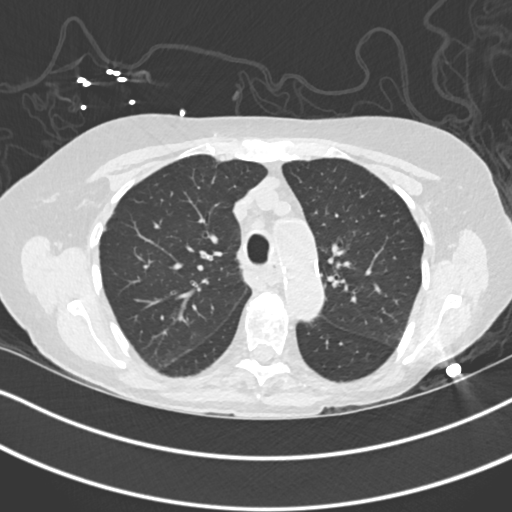
[im 119/155  lung]
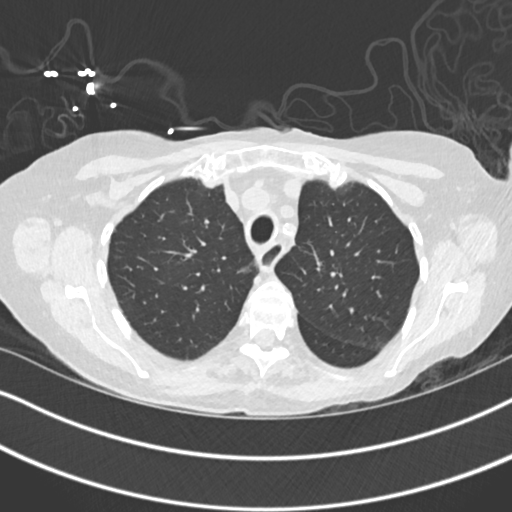
[im 131/155  lung]
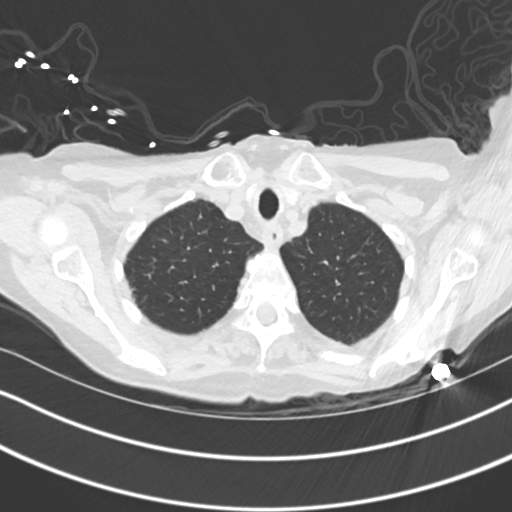
[im 143/155  lung]
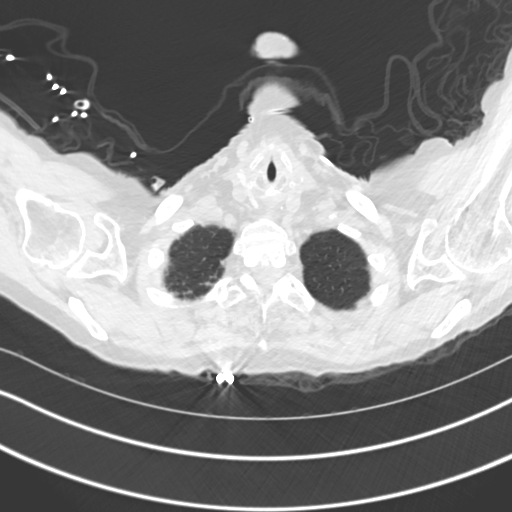

[Series 6: cor · coronal · 0.61mm/px · 3 of 113 slices shown]
[im 23/113  lung]
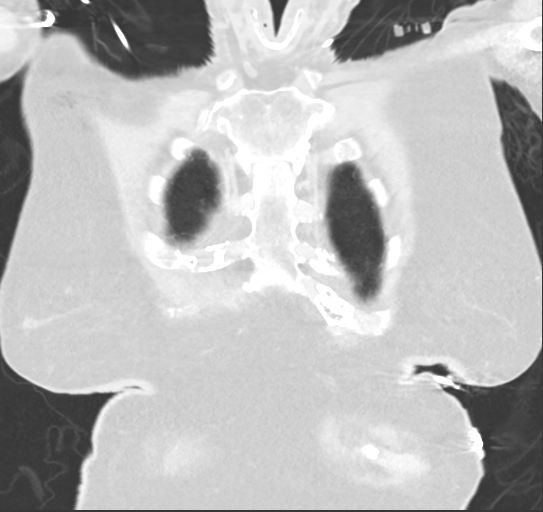
[im 45/113  lung]
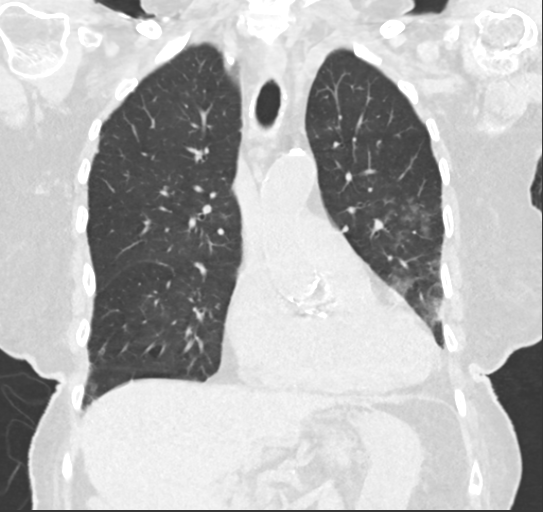
[im 68/113  lung]
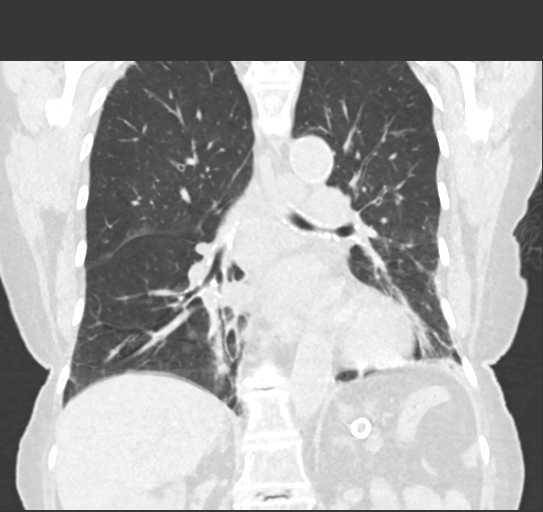

[15 of 36 positions shown; findings below may reference images not displayed]

FINDINGS: Cardiovascular: Atherosclerosis of the great vessels with normal
branch pattern. Mild to moderate aortic atherosclerosis without
aneurysm. Heart is mildly enlarged without pericardial effusion.
There is left main and three-vessel coronary arteriosclerosis.
Pulmonary vasculature is unremarkable for this unenhanced study.

Mediastinum/Nodes: Normal thyroid gland without focal mass. Patent
trachea and mainstem bronchi. Unremarkable CT appearance of the
esophagus. No mediastinal or hilar lymphadenopathy given limitations
of a noncontrast study.

Lungs/Pleura: Patchy pneumonic consolidation and atelectasis is seen
in the left lower lobe with faint ground-glass opacities in the left
upper lobe and lingula. Minimal atelectasis is seen in the right
lower lobe. There is mild bilateral bronchiectasis and
bronchiolectasis.

Upper Abdomen: Slight surface nodularity of the liver compatible
with morphologic changes of cirrhosis. No space-occupying mass nor
biliary dilatation. No acute abnormality in the upper abdomen.

Musculoskeletal: No chest wall mass or suspicious bone lesions
identified.
IMPRESSION: 1. Left lower lobe pulmonary consolidations compatible with
aspiration pneumonia given reported history. Lesser degree of faint
ground-glass opacities are noted in the left upper lobe and lingula.
2. There is bilateral bronchiectasis and bronchiolectasis.
3. Cardiomegaly with coronary arteriosclerosis.
4. Aortic atherosclerosis.

Aortic Atherosclerosis ([id number]).
